# Patient Record
Sex: Female | Born: 1995 | Race: Black or African American | Hispanic: No | Marital: Single | Smoking: Never smoker
Health system: Western US, Academic
[De-identification: ages and names within clinical notes are randomized; demographics above are authoritative.]

## PROBLEM LIST (undated history)

## (undated) ENCOUNTER — Inpatient Hospital Stay (HOSPITAL_COMMUNITY): Admission: AD | Payer: Self-pay

## (undated) DIAGNOSIS — F938 Other childhood emotional disorders: Secondary | ICD-10-CM

## (undated) DIAGNOSIS — F32A Depression, unspecified: Secondary | ICD-10-CM

## (undated) DIAGNOSIS — R519 Headache, unspecified: Secondary | ICD-10-CM

## (undated) DIAGNOSIS — N390 Urinary tract infection, site not specified: Secondary | ICD-10-CM

## (undated) DIAGNOSIS — G4700 Insomnia, unspecified: Secondary | ICD-10-CM

## (undated) DIAGNOSIS — F3481 Disruptive mood dysregulation disorder: Secondary | ICD-10-CM

## (undated) DIAGNOSIS — F419 Anxiety disorder, unspecified: Secondary | ICD-10-CM

## (undated) DIAGNOSIS — R51 Headache: Secondary | ICD-10-CM

## (undated) HISTORY — PX: WISDOM TOOTH EXTRACTION: SHX21

---
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
## 2017-01-11 ENCOUNTER — Ambulatory Visit (INDEPENDENT_AMBULATORY_CARE_PROVIDER_SITE_OTHER): Payer: BLUE CROSS/BLUE SHIELD | Admitting: Adult Health

## 2017-01-11 ENCOUNTER — Encounter: Payer: Self-pay | Admitting: Adult Health

## 2017-01-11 VITALS — BP 102/60 | HR 58 | Resp 18 | Ht 62.0 in | Wt 111.0 lb

## 2017-01-11 DIAGNOSIS — R634 Abnormal weight loss: Secondary | ICD-10-CM | POA: Insufficient documentation

## 2017-01-11 DIAGNOSIS — F329 Major depressive disorder, single episode, unspecified: Secondary | ICD-10-CM | POA: Diagnosis not present

## 2017-01-11 DIAGNOSIS — Z113 Encounter for screening for infections with a predominantly sexual mode of transmission: Secondary | ICD-10-CM

## 2017-01-11 DIAGNOSIS — N939 Abnormal uterine and vaginal bleeding, unspecified: Secondary | ICD-10-CM

## 2017-01-11 DIAGNOSIS — F32A Depression, unspecified: Secondary | ICD-10-CM

## 2017-01-11 DIAGNOSIS — Z3202 Encounter for pregnancy test, result negative: Secondary | ICD-10-CM

## 2017-01-11 DIAGNOSIS — N644 Mastodynia: Secondary | ICD-10-CM | POA: Diagnosis not present

## 2017-01-11 LAB — POCT URINE PREGNANCY: PREG TEST UR: NEGATIVE

## 2017-01-11 NOTE — Progress Notes (Signed)
Subjective:     Patient ID: Sydney Higgins, female   DOB: Oct 14, 1995, 21 y.o.   MRN: [id number]  HPI Sydney Higgins is a 21 year old black female in complaining of heavy prolonged bleeding and weight loss of about 20 lbs in last 2 months and breast pain that is sharp at times, near the areola.  PCP is Dr Cindie Laroche.   Review of Systems +heavy prolonged bleeding +weight loss +breast pain +sexaully active without birth control  Reviewed past medical,surgical, social and family history. Reviewed medications and allergies.     Objective:   Physical Exam BP 102/60 (BP Location: Left Arm, Patient Position: Sitting, Cuff Size: Normal)   Pulse (!) 58   Resp 18   Ht 5\' 2"  (1.575 m)   Wt 111 lb (50.3 kg)   LMP 01/08/2017 (Exact Date)   BMI 20.30 kg/m UPT negative.  Skin warm and dry,  Breasts:no dominate palpable mass, retraction or nipple discharge Pelvic: external genitalia is normal in appearance no lesions, vagina: period like blood,urethra has no lesions or masses noted, cervix:smooth, no CMT, uterus: normal size, shape and contour, non tender, no masses felt, adnexa: no masses or tenderness noted. Bladder is non tender and no masses felt.  GC/CHL obtained.    PHQ 9 score 20, denies being suicidal or homicidal, was on Latuda and stopped about 4 months ago and had had counseling and was seen at [facility]. Declines any meds.She is not really interested in OCs, at this time.   Assessment:     1. Abnormal uterine bleeding (AUB)   2. Weight loss   3. Breast pain   4. Depression, unspecified depression type   5. Screening examination for STD (sexually transmitted disease)       Plan:     Check CBC,CMP,TSH and free T4, HIV and RPR GC/CHL sent F/U in 1 week to discuss labs and options

## 2017-01-12 LAB — CBC
HEMATOCRIT: 40.3 % (ref 34.0–46.6)
HEMOGLOBIN: 13.2 g/dL (ref 11.1–15.9)
MCH: 28 pg (ref 26.6–33.0)
MCHC: 32.8 g/dL (ref 31.5–35.7)
MCV: 85 fL (ref 79–97)
Platelets: 226 10*3/uL (ref 150–379)
RBC: 4.72 x10E6/uL (ref 3.77–5.28)
RDW: 13.4 % (ref 12.3–15.4)
WBC: 5.3 10*3/uL (ref 3.4–10.8)

## 2017-01-12 LAB — COMPREHENSIVE METABOLIC PANEL
ALBUMIN: 4.7 g/dL (ref 3.5–5.5)
ALK PHOS: 64 IU/L (ref 39–117)
ALT: 23 IU/L (ref 0–32)
AST: 19 IU/L (ref 0–40)
Albumin/Globulin Ratio: 1.5 (ref 1.2–2.2)
BILIRUBIN TOTAL: 0.3 mg/dL (ref 0.0–1.2)
BUN / CREAT RATIO: 10 (ref 9–23)
BUN: 8 mg/dL (ref 6–20)
CHLORIDE: 101 mmol/L (ref 96–106)
CO2: 25 mmol/L (ref 20–29)
CREATININE: 0.84 mg/dL (ref 0.57–1.00)
Calcium: 9.7 mg/dL (ref 8.7–10.2)
GFR calc non Af Amer: 101 mL/min/{1.73_m2} (ref >59)
GFR, EST AFRICAN AMERICAN: 117 mL/min/{1.73_m2} (ref >59)
GLOBULIN, TOTAL: 3.2 g/dL (ref 1.5–4.5)
Glucose: 92 mg/dL (ref 65–99)
Potassium: 4.6 mmol/L (ref 3.5–5.2)
SODIUM: 140 mmol/L (ref 134–144)
TOTAL PROTEIN: 7.9 g/dL (ref 6.0–8.5)

## 2017-01-12 LAB — T4, FREE: Free T4: 1.09 ng/dL (ref 0.93–1.60)

## 2017-01-12 LAB — HIV ANTIBODY (ROUTINE TESTING W REFLEX): HIV Screen 4th Generation wRfx: NONREACTIVE

## 2017-01-12 LAB — RPR: RPR: NONREACTIVE

## 2017-01-12 LAB — TSH: TSH: 2.28 u[IU]/mL (ref 0.450–4.500)

## 2017-01-13 ENCOUNTER — Ambulatory Visit: Payer: BLUE CROSS/BLUE SHIELD | Admitting: General Surgery

## 2017-01-13 LAB — GC/CHLAMYDIA PROBE AMP
CHLAMYDIA, DNA PROBE: NEGATIVE
Neisseria gonorrhoeae by PCR: NEGATIVE

## 2017-01-16 ENCOUNTER — Emergency Department (HOSPITAL_COMMUNITY): Payer: BLUE CROSS/BLUE SHIELD

## 2017-01-16 ENCOUNTER — Emergency Department (HOSPITAL_COMMUNITY)
Admission: EM | Admit: 2017-01-16 | Discharge: 2017-01-17 | Disposition: A | Discharge status: Home / Self Care | Payer: BLUE CROSS/BLUE SHIELD | Attending: Emergency Medicine | Admitting: Emergency Medicine

## 2017-01-16 ENCOUNTER — Encounter (HOSPITAL_COMMUNITY): Payer: Self-pay | Admitting: *Deleted

## 2017-01-16 DIAGNOSIS — S0181XA Laceration without foreign body of other part of head, initial encounter: Secondary | ICD-10-CM | POA: Diagnosis not present

## 2017-01-16 DIAGNOSIS — Y999 Unspecified external cause status: Secondary | ICD-10-CM | POA: Insufficient documentation

## 2017-01-16 DIAGNOSIS — S01112A Laceration without foreign body of left eyelid and periocular area, initial encounter: Secondary | ICD-10-CM | POA: Insufficient documentation

## 2017-01-16 DIAGNOSIS — Y939 Activity, unspecified: Secondary | ICD-10-CM | POA: Insufficient documentation

## 2017-01-16 DIAGNOSIS — Y92410 Unspecified street and highway as the place of occurrence of the external cause: Secondary | ICD-10-CM | POA: Insufficient documentation

## 2017-01-16 DIAGNOSIS — M549 Dorsalgia, unspecified: Secondary | ICD-10-CM | POA: Diagnosis not present
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
## 2017-01-16 NOTE — ED Notes (Signed)
Patient stated she was having chest tightness, EKG performed and given to EDP [facility].
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
## 2017-01-17 MED ORDER — CYCLOBENZAPRINE HCL 5 MG PO TABS: 10.0000 mg | ORAL_TABLET | Freq: Two times a day (BID) | ORAL | 0 refills | Status: DC | PRN

## 2017-01-17 MED ORDER — IBUPROFEN 600 MG PO TABS: 600.0000 mg | ORAL_TABLET | Freq: Four times a day (QID) | ORAL | 0 refills | Status: DC | PRN

## 2017-01-17 NOTE — Discharge Instructions (Signed)
Keep the lacerations clean and dry.  The steri-strips should begin to peel off in a week or so.  Apply ice packs on/off to your neck.  Follow-up with your doctor for recheck or return here if needed.

## 2017-01-18 ENCOUNTER — Ambulatory Visit: Payer: BLUE CROSS/BLUE SHIELD | Admitting: Adult Health

## 2017-02-14 ENCOUNTER — Emergency Department (HOSPITAL_COMMUNITY)
Admission: EM | Admit: 2017-02-14 | Discharge: 2017-02-14 | Disposition: A | Discharge status: Home / Self Care | Payer: BLUE CROSS/BLUE SHIELD | Attending: Emergency Medicine | Admitting: Emergency Medicine

## 2017-02-14 ENCOUNTER — Emergency Department (HOSPITAL_COMMUNITY): Payer: BLUE CROSS/BLUE SHIELD

## 2017-02-14 ENCOUNTER — Encounter (HOSPITAL_COMMUNITY): Payer: Self-pay

## 2017-02-14 DIAGNOSIS — J029 Acute pharyngitis, unspecified: Secondary | ICD-10-CM | POA: Diagnosis present

## 2017-02-14 DIAGNOSIS — R6889 Other general symptoms and signs: Secondary | ICD-10-CM | POA: Diagnosis not present

## 2017-02-14 LAB — CBC WITH DIFFERENTIAL/PLATELET
BASOS ABS: 0 10*3/uL (ref 0.0–0.1)
Basophils Relative: 0 %
Eosinophils Absolute: 0.2 10*3/uL (ref 0.0–0.7)
Eosinophils Relative: 2 %
HEMATOCRIT: 37.4 % (ref 36.0–46.0)
HEMOGLOBIN: 12.1 g/dL (ref 12.0–15.0)
LYMPHS PCT: 25 %
Lymphs Abs: 2 10*3/uL (ref 0.7–4.0)
MCH: 26.9 pg (ref 26.0–34.0)
MCHC: 32.4 g/dL (ref 30.0–36.0)
MCV: 83.1 fL (ref 78.0–100.0)
MONOS PCT: 9 %
Monocytes Absolute: 0.7 10*3/uL (ref 0.1–1.0)
NEUTROS ABS: 4.9 10*3/uL (ref 1.7–7.7)
NEUTROS PCT: 64 %
PLATELETS: 274 10*3/uL (ref 150–400)
RBC: 4.5 MIL/uL (ref 3.87–5.11)
RDW: 12.7 % (ref 11.5–15.5)
WBC: 7.8 10*3/uL (ref 4.0–10.5)

## 2017-02-14 LAB — BASIC METABOLIC PANEL
ANION GAP: 9 (ref 5–15)
BUN: 10 mg/dL (ref 6–20)
CALCIUM: 9.3 mg/dL (ref 8.9–10.3)
CO2: 25 mmol/L (ref 22–32)
Chloride: 104 mmol/L (ref 101–111)
Creatinine, Ser: 0.87 mg/dL (ref 0.44–1.00)
GFR calc Af Amer: >60 mL/min (ref >60)
GFR calc non Af Amer: >60 mL/min (ref >60)
GLUCOSE: 88 mg/dL (ref 65–99)
Potassium: 3.5 mmol/L (ref 3.5–5.1)
Sodium: 138 mmol/L (ref 135–145)

## 2017-02-14 LAB — MONONUCLEOSIS SCREEN: Mono Screen: NEGATIVE

## 2017-02-14 LAB — RAPID STREP SCREEN (MED CTR MEBANE ONLY): Streptococcus, Group A Screen (Direct): NEGATIVE

## 2017-02-14 MED ORDER — NAPROXEN 500 MG PO TABS: 500.0000 mg | ORAL_TABLET | Freq: Two times a day (BID) | ORAL | 0 refills | Status: DC

## 2017-02-14 NOTE — ED Triage Notes (Signed)
Pt reports sore throat and earache x 1 1/2 weeks.

## 2017-02-14 NOTE — Discharge Instructions (Signed)
Workup here today without any specific findings.  Chest x-ray negative for pneumonia.  Rapid strep test negative.  Mononucleosis test negative as well.  Suspect this is a viral upper respiratory infection with pharyngitis.  Take the Naprosyn for the throat pain.  Follow-up with your doctor.  Return for any new or worse symptoms.

## 2017-02-14 NOTE — ED Provider Notes (Signed)
[facility] EMERGENCY DEPARTMENT Provider Note   CSN: [id number] Arrival date & time: 02/14/17  3244     History   Chief Complaint Chief Complaint  Patient presents with  . Sore Throat    HPI Sydney Higgins is a 21 y.o. female.  Patient with a complaint of sore throat or ear pain cough congestion and some intermittent fevers for a week and a half.  No fevers recently.  Patient was evaluated at [facility] said she had a rapid strep test that was negative but they put her on antibiotic made no difference.  Patient states that she had mononucleosis at age 66.  Patient states she just does not feel well.      Past Medical History:  Diagnosis Date  . Anxiety   . 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 DMDD (disruptive mood dysregulation disorder) ([place]) 12/11/2015  . Headache   . Insomnia 12/11/2015    Patient Active Problem List   Diagnosis Date Noted  . Depression 01/11/2017  . Breast pain 01/11/2017  . Weight loss 01/11/2017  . Abnormal uterine bleeding (AUB) 01/11/2017  . Screening examination for STD (sexually transmitted disease) 01/11/2017  . Constipation 12/17/2015  . MDD (major depressive disorder), recurrent episode, severe ([place]) 12/11/2015  . 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 DMDD (disruptive mood dysregulation disorder) ([place]) 12/11/2015  . Insomnia 12/11/2015    History reviewed. No pertinent surgical history.  OB History    Gravida Para Term Preterm AB Living   2       2 0   SAB TAB Ectopic Multiple Live Births   2               Home Medications    Prior to Admission medications   Medication Sig Start Date End Date Taking? Authorizing Provider  cyclobenzaprine (FLEXERIL) 5 MG tablet Take 2 tablets (10 mg total) by mouth 2 (two) times daily as needed for muscle spasms. Patient not taking: Reported on 02/14/2017 01/17/17   Triplett, Lynelle Smoke, PA-C  ibuprofen (ADVIL,MOTRIN) 600 MG tablet Take 1 tablet (600 mg total) by mouth every 6 (six)  hours as needed. Patient not taking: Reported on 02/14/2017 01/17/17   Kem Parkinson, PA-C  naproxen (NAPROSYN) 500 MG tablet Take 1 tablet (500 mg total) 2 (two) times daily by mouth. 02/14/17   Fredia Sorrow, MD    Family History Family History  Problem Relation Age of Onset  . Cancer Paternal Grandfather   . Cancer Paternal Grandmother   . Diabetes Maternal Grandmother   . Diabetes Maternal Grandfather   . Hypertension Father   . Bipolar disorder Brother   . Deafness Sister     Social History Social History   Tobacco Use  . Smoking status: Never Smoker  . Smokeless tobacco: Never Used  Substance Use Topics  . Alcohol use: No  . Drug use: No     Allergies   Apple; Banana; Bee venom; and Pear   Review of Systems Review of Systems  Constitutional: Positive for fever.  HENT: Positive for congestion, ear pain and sore throat.   Eyes: Negative for redness.  Respiratory: Negative for shortness of breath.   Cardiovascular: Negative for chest pain.  Gastrointestinal: Negative for abdominal pain, diarrhea, nausea and vomiting.  Genitourinary: Negative for dysuria.  Musculoskeletal: Positive for myalgias.  Neurological: Negative for headaches.  Hematological: Does not bruise/bleed easily.  Psychiatric/Behavioral: Negative for confusion.     Physical Exam Updated Vital Signs BP 117/86   Pulse  93   Temp 98.3 F (36.8 C) (Oral)   Resp 18   Ht 1.575 m (5\' 2" )   Wt 53.5 kg (118 lb)   LMP 02/03/2017   SpO2 98%   BMI 21.58 kg/m   Physical Exam  Constitutional: She is oriented to person, place, and time. She appears well-developed and well-nourished. No distress.  HENT:  Head: Normocephalic and atraumatic.  Mouth/Throat: No oropharyngeal exudate.  Posterior pharynx without erythema.  Uvula midline.  No tonsillar enlargement.  No exudate.  Mucous membranes slightly dry.  Eyes: Conjunctivae and EOM are normal. Pupils are equal, round, and reactive to light.    Neck: Normal range of motion. Neck supple.  Cardiovascular: Normal rate, regular rhythm and normal heart sounds.  Pulmonary/Chest: Effort normal and breath sounds normal. No respiratory distress. She has no wheezes.  Abdominal: Soft. Bowel sounds are normal.  Musculoskeletal: Normal range of motion. She exhibits no edema.  Neurological: She is alert and oriented to person, place, and time. No cranial nerve deficit or sensory deficit. She exhibits normal muscle tone. Coordination normal.  Skin: Skin is warm. No rash noted.  Nursing note and vitals reviewed.    ED Treatments / Results  Labs (all labs ordered are listed, but only abnormal results are displayed) Labs Reviewed  RAPID STREP SCREEN (NOT AT [facility])  CULTURE, GROUP A STREP ([place])  CBC WITH DIFFERENTIAL/PLATELET  BASIC METABOLIC PANEL  MONONUCLEOSIS SCREEN    EKG  EKG Interpretation None       Radiology Dg Chest 2 View  Result Date: 02/14/2017 CLINICAL DATA:  Cough, fever, sore throat and ear pain for 1.5 weeks. EXAM: CHEST  2 VIEW COMPARISON:  Radiographs 02/06/2017 and 07/02/2016. FINDINGS: The heart size and mediastinal contours are normal. The lungs are clear. There is no pleural effusion or pneumothorax. No acute osseous findings are identified. Minimal thoracic scoliosis noted. IMPRESSION: No active cardiopulmonary process. Electronically Signed   By: Richardean Sale M.D.   On: 02/14/2017 09:10    Procedures Procedures (including critical care time)  Medications Ordered in ED Medications - No data to display   Initial Impression / Assessment and Plan / ED Course  I have reviewed the triage vital signs and the nursing notes.  Pertinent labs & imaging results that were available during my care of the patient were reviewed by me and considered in my medical decision making (see chart for details).    Workup rapid strep negative again.  Chest x-ray negative for pneumonia.  Monospot negative.  Labs without  significant abnormalities.  Patient symptoms probably consistent with a viral pharyngitis and may be flulike illness.  Based on the duration of the symptoms not a candidate for Tamiflu.  Patient will be treated symptomatically with Naprosyn for the sore throat which is her main complaint now.  No further fevers cough and congestion has improved.  Feel that patient may be starting to improve overall.     Final Clinical Impressions(s) / ED Diagnoses   Final diagnoses:  Acute pharyngitis, unspecified etiology  Flu-like symptoms    ED Discharge Orders        Ordered    naproxen (NAPROSYN) 500 MG tablet  2 times daily     02/14/17 1106       Fredia Sorrow, MD 02/14/17 1712

## 2017-02-16 LAB — CULTURE, GROUP A STREP (THRC)

## 2017-04-14 ENCOUNTER — Ambulatory Visit: Payer: BLUE CROSS/BLUE SHIELD | Admitting: Adult Health

## 2017-04-14 ENCOUNTER — Encounter: Payer: Self-pay | Admitting: Adult Health

## 2017-08-04 ENCOUNTER — Other Ambulatory Visit: Payer: Self-pay

## 2017-08-04 ENCOUNTER — Encounter (INDEPENDENT_AMBULATORY_CARE_PROVIDER_SITE_OTHER): Payer: Self-pay

## 2017-08-04 ENCOUNTER — Ambulatory Visit: Payer: BLUE CROSS/BLUE SHIELD | Admitting: Adult Health

## 2017-08-04 ENCOUNTER — Encounter: Payer: Self-pay | Admitting: Adult Health

## 2017-08-04 VITALS — BP 124/80 | HR 72 | Ht 61.0 in | Wt 99.0 lb

## 2017-08-04 DIAGNOSIS — R634 Abnormal weight loss: Secondary | ICD-10-CM | POA: Diagnosis not present

## 2017-08-04 DIAGNOSIS — R1030 Lower abdominal pain, unspecified: Secondary | ICD-10-CM | POA: Diagnosis not present

## 2017-08-04 DIAGNOSIS — R112 Nausea with vomiting, unspecified: Secondary | ICD-10-CM

## 2017-08-04 DIAGNOSIS — L720 Epidermal cyst: Secondary | ICD-10-CM | POA: Diagnosis not present

## 2017-08-04 NOTE — Progress Notes (Signed)
  Subjective:     Patient ID: Sydney Higgins, female   DOB: 09-20-1995, 22 y.o.   MRN: [id number]  HPI Naomy is a 22 year old black female in complaining of pain in abdomen for about a month, with nausea and vomiting at times and weight loss. She had labs with Dr Cindie Laroche 5/3 and all was normal except vitamin D low at 15.She gets depo at [facility], and was told she had ovarian cyst on past.  PCP is Dr Cindie Laroche.  Review of Systems +abdominal pain for about a month +nausea and vomiting at times +weight loss  Reviewed past medical,surgical, social and family history. Reviewed medications and allergies.     Objective:   Physical Exam BP 124/80 (BP Location: Right Arm, Patient Position: Sitting, Cuff Size: Normal)   Pulse 72   Ht 5\' 1"  (1.549 m)   Wt 99 lb (44.9 kg)   BMI 18.71 kg/m    Skin warm and dry. Neck: mid line trachea, normal thyroid, good ROM, no lymphadenopathy noted. Lungs: clear to ausculation bilaterally. Cardiovascular: regular rate and rhythm, has 2 cm, slightly tender,non mobile nodule left side of neck.Abdomen is soft, no HSM noted, +tenderness across lower pelvis. Has some point tenderness in low back.  Discussed if all is normal on [place] stopping depo and trying an OC.  Assessment:     1. Lower abdominal pain   2. Weight loss   3. Epidermal cyst of neck       Plan:     Get abdominal and pelvic US at [facility] 5/20 at 9:30 am Referred to Dr Constance Haw for cyst excision on neck F/U in 2 weeks

## 2017-08-11 ENCOUNTER — Encounter: Payer: Self-pay | Admitting: General Surgery

## 2017-08-11 ENCOUNTER — Ambulatory Visit: Payer: BLUE CROSS/BLUE SHIELD | Admitting: General Surgery

## 2017-08-11 VITALS — BP 136/92 | HR 84 | Temp 99.1°F | Resp 16 | Wt 102.0 lb

## 2017-08-11 DIAGNOSIS — L723 Sebaceous cyst: Secondary | ICD-10-CM

## 2017-08-11 NOTE — Patient Instructions (Signed)
Epidermal Cyst An epidermal cyst is a small, painless lump under your skin. It may be called an epidermal inclusion cyst or an infundibular cyst. The cyst contains a grayish-white, bad-smelling substance (keratin). It is important not to pop epidermal cysts yourself. These cysts are usually harmless (benign), but they can get infected. Symptoms of infection may include:  Redness.  Inflammation.  Tenderness.  Warmth.  Fever.  A grayish-white, bad-smelling substance draining from the cyst.  Pus draining from the cyst.  Follow these instructions at home:  Take over-the-counter and prescription medicines only as told by your doctor.  If you were prescribed an antibiotic, use it as told by your doctor. Do not stop using the antibiotic even if you start to feel better.  Keep the area around your cyst clean and dry.  Wear loose, dry clothing.  Do not try to pop your cyst.  Avoid touching your cyst.  Check your cyst every day for signs of infection.  Keep all follow-up visits as told by your doctor. This is important. How is this prevented?  Wear clean, dry, clothing.  Avoid wearing tight clothing.  Keep your skin clean and dry. Shower or take baths every day.  Wash your body with a benzoyl peroxide wash when you shower or bathe. Contact a health care provider if:  Your cyst has symptoms of infection.  Your condition is not improving or is getting worse.  You have a cyst that looks different from other cysts you have had.  You have a fever. Get help right away if:  Redness spreads from the cyst into the surrounding area. This information is not intended to replace advice given to you by your health care provider. Make sure you discuss any questions you have with your health care provider. Document Released: 04/22/2004 Document Revised: 11/12/2015 Document Reviewed: 01/15/2015 Elsevier Interactive Patient Education  2018 Elsevier Inc.  

## 2017-08-11 NOTE — Progress Notes (Signed)
[facility] History and Physical  Reason for Referral: Cyst on left neck Referring Physician:  Dr. Eda Paschal Sydney Higgins.  HPI: Sydney Higgins is a 22 yo otherwise healthy patient who presents with an enlarging cyst on the left lateral neck. She says it has been there for 4 years at least. It has slowly been growing and causes her to have some social anxiety related to people asking her about the lesion.  She denies any erythema, tenderness, or drainage.   She otherwise is healthy. And does not take medicine other than birth control. She has been having some fever and chills on and off.  Her father had a cyst that I removed from his arm several months ago.   Past Medical History:  Diagnosis Date  . Anxiety   . 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 DMDD (disruptive mood dysregulation disorder) ([place]) 12/11/2015  . Headache   . Insomnia 12/11/2015    History reviewed. No pertinent surgical history.  Family History  Problem Relation Age of Onset  . Cancer Paternal Grandfather   . Cancer Paternal Grandmother   . Diabetes Maternal Grandmother   . Diabetes Maternal Grandfather   . Hypertension Father   . Bipolar disorder Brother   . Deafness Sister     Social History   Tobacco Use  . Smoking status: Never Smoker  . Smokeless tobacco: Never Used  Substance Use Topics  . Alcohol use: No  . Drug use: No    Medications: I have reviewed the patient's current medications. Allergies as of 08/11/2017      Reactions   Apple    Can eat the fruit, but not the peel   Banana    Bee Venom    Pear       Medication List        Accurate as of 08/11/17  4:21 PM. Always use your most recent med list.          medroxyPROGESTERone Acetate 150 MG/ML Susy        ROS:  A comprehensive review of systems was negative except for: Constitutional: positive for chills, fevers and headache Gastrointestinal: positive for abdominal pain, nausea  and reflux symptoms Musculoskeletal: positive for back pain and neck pain  Blood pressure (!) 136/92, pulse 84, temperature 99.1 F (37.3 C), temperature source Temporal, resp. rate 16, weight 102 lb (46.3 kg). Physical Exam  Constitutional: She is oriented to person, place, and time. She appears well-developed and well-nourished.  HENT:  Head: Normocephalic and atraumatic.  Eyes: Pupils are equal, round, and reactive to light. EOM are normal.  Neck: Normal range of motion. Neck supple.  On posterior left neck 1cm hard area that is mobile, posterior to the SCM and in the mid neck   Cardiovascular: Normal rate and regular rhythm.  Pulmonary/Chest: Effort normal and breath sounds normal.  Abdominal: Soft. She exhibits no distension. There is no tenderness.  Musculoskeletal: Normal range of motion. She exhibits no edema.  Neurological: She is alert and oriented to person, place, and time.  Skin: Skin is dry.  Psychiatric: She has a normal mood and affect. Her behavior is normal. Judgment and thought content normal.  Vitals reviewed.   Results: CT max face from 12/2016- looked to see if I can see the cyst, on coronals see the start of it, which appears to be superficial to the muscle   Assessment & Plan:  Sydney Higgins is a  22 y.o. Higgins with a left neck sebaceous/ epidermoid cyst. She feels that it is bigger and is causing her some emotional distress/ anxiety due to people questioning her about it.  She is self conscious and wears certain clothes and jewelry.   -OR for excision of the cyst in the upcoming weeks    All questions were answered to the satisfaction of the patient and family.  The risk and benefits of excision of the cyst were discussed including but not limited to bleeding, infection, risk of recurrence, risk of nerve injury/ numbness, risk of being something other than a cyst like a lymph node.  After careful consideration, Sydney Higgins has decided to proceed.    Sydney Higgins 08/11/2017, 4:21 PM

## 2017-08-12 NOTE — H&P (Signed)
[facility] History and Physical  Reason for Referral: Cyst on left neck Referring Physician:  Dr. Eda Paschal Sydney Higgins is a 22 y.o. female.  HPI: Sydney Higgins is a 22 yo otherwise healthy patient who presents with an enlarging cyst on the left lateral neck. She says it has been there for 4 years at least. It has slowly been growing and causes her to have some social anxiety related to people asking her about the lesion.  She denies any erythema, tenderness, or drainage.   She otherwise is healthy. And does not take medicine other than birth control. She has been having some fever and chills on and off.  Her father had a cyst that I removed from his arm several months ago.       Past Medical History:  Diagnosis Date  . Anxiety   . 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 DMDD (disruptive mood dysregulation disorder) ([place]) 12/11/2015  . Headache   . Insomnia 12/11/2015    History reviewed. No pertinent surgical history.       Family History  Problem Relation Age of Onset  . Cancer Paternal Grandfather   . Cancer Paternal Grandmother   . Diabetes Maternal Grandmother   . Diabetes Maternal Grandfather   . Hypertension Father   . Bipolar disorder Brother   . Deafness Sister     Social History       Tobacco Use  . Smoking status: Never Smoker  . Smokeless tobacco: Never Used  Substance Use Topics  . Alcohol use: No  . Drug use: No    Medications: I have reviewed the patient's current medications.      Allergies as of 08/11/2017      Reactions   Apple    Can eat the fruit, but not the peel   Banana    Bee Venom    Pear                Medication List            Accurate as of 08/11/17  4:21 PM. Always use your most recent med list.           medroxyPROGESTERone Acetate 150 MG/ML Susy        ROS:  A comprehensive review of systems was negative except for: Constitutional: positive for  chills, fevers and headache Gastrointestinal: positive for abdominal pain, nausea and reflux symptoms Musculoskeletal: positive for back pain and neck pain  Blood pressure (!) 136/92, pulse 84, temperature 99.1 F (37.3 C), temperature source Temporal, resp. rate 16, weight 102 lb (46.3 kg). Physical Exam  Constitutional: She is oriented to person, place, and time. She appears well-developed and well-nourished.  HENT:  Head: Normocephalic and atraumatic.  Eyes: Pupils are equal, round, and reactive to light. EOM are normal.  Neck: Normal range of motion. Neck supple.  On posterior left neck 1cm hard area that is mobile, posterior to the SCM and in the mid neck   Cardiovascular: Normal rate and regular rhythm.  Pulmonary/Chest: Effort normal and breath sounds normal.  Abdominal: Soft. She exhibits no distension. There is no tenderness.  Musculoskeletal: Normal range of motion. She exhibits no edema.  Neurological: She is alert and oriented to person, place, and time.  Skin: Skin is dry.  Psychiatric: She has a normal mood and affect. Her behavior is normal. Judgment and thought content normal.  Vitals reviewed.   Results: CT max face from 12/2016- looked to see  if I can see the cyst, on coronals see the start of it, which appears to be superficial to the muscle   Assessment & Plan:  Sydney Higgins is a 22 y.o. female with a left neck sebaceous/ epidermoid cyst. She feels that it is bigger and is causing her some emotional distress/ anxiety due to people questioning her about it.  She is self conscious and wears certain clothes and jewelry.   -OR for excision of the cyst in the upcoming weeks    All questions were answered to the satisfaction of the patient and family.  The risk and benefits of excision of the cyst were discussed including but not limited to bleeding, infection, risk of recurrence, risk of nerve injury/ numbness, risk of being something other than a cyst like a  lymph node.  After careful consideration, Sydney Higgins has decided to proceed.    Virl Cagey 08/11/2017, 4:21 PM

## 2017-08-15 ENCOUNTER — Ambulatory Visit (HOSPITAL_COMMUNITY)
Admission: RE | Admit: 2017-08-15 | Discharge: 2017-08-15 | Disposition: A | Discharge status: Home / Self Care | Payer: BLUE CROSS/BLUE SHIELD | Source: Ambulatory Visit | Attending: Adult Health | Admitting: Adult Health

## 2017-08-15 DIAGNOSIS — R1030 Lower abdominal pain, unspecified: Secondary | ICD-10-CM

## 2017-08-15 DIAGNOSIS — R634 Abnormal weight loss: Secondary | ICD-10-CM

## 2017-08-16 ENCOUNTER — Telehealth: Payer: Self-pay | Admitting: *Deleted

## 2017-08-16 NOTE — Telephone Encounter (Signed)
Pt aware [place] results normal, has appt 5/23 will discuss birth control options then ,may stop depo and try something else

## 2017-08-18 ENCOUNTER — Ambulatory Visit: Payer: BLUE CROSS/BLUE SHIELD | Admitting: Adult Health

## 2017-08-18 ENCOUNTER — Telehealth: Payer: Self-pay | Admitting: Obstetrics & Gynecology

## 2017-08-18 NOTE — Telephone Encounter (Signed)
Spoke with pt's mom letting her know it was up to pt if she comes in or not. JAG was going to discuss stopping Depo and starting new birth control. Pt's mom stated they would come in today for appt. [place]

## 2017-08-18 NOTE — Telephone Encounter (Signed)
Pt's mom called back stating pt was going to reschedule. Will call back and schedule appt. [place]

## 2017-08-19 NOTE — Patient Instructions (Signed)
Sydney Higgins  08/19/2017     @PREFPERIOPPHARMACY @   Your procedure is scheduled on 08/26/2017.  Report to [facility] at 7:30 A.M.  Call this number if you have problems the morning of surgery:  [phone]   Remember:  No food or drink after midnight.     Take these medicines the morning of surgery with A SIP OF WATER NONE    Do not wear jewelry, make-up or nail polish.  Do not wear lotions, powders, or perfumes, or deodorant.  Do not shave 48 hours prior to surgery.  Men may shave face and neck.  Do not bring valuables to the hospital.  [facility] is not responsible for any belongings or valuables.  Contacts, dentures or bridgework may not be worn into surgery.  Leave your suitcase in the car.  After surgery it may be brought to your room.  For patients admitted to the hospital, discharge time will be determined by your treatment team.  Patients discharged the day of surgery will not be allowed to drive home.   Please read over the following fact sheets that you were given. Surgical Site Infection Prevention and Anesthesia Post-op Instructions     PATIENT INSTRUCTIONS POST-ANESTHESIA  IMMEDIATELY FOLLOWING SURGERY:  Do not drive or operate machinery for the first twenty four hours after surgery.  Do not make any important decisions for twenty four hours after surgery or while taking narcotic pain medications or sedatives.  If you develop intractable nausea and vomiting or a severe headache please notify your doctor immediately.  FOLLOW-UP:  Please make an appointment with your surgeon as instructed. You do not need to follow up with anesthesia unless specifically instructed to do so.  WOUND CARE INSTRUCTIONS (if applicable):  Keep a dry clean dressing on the anesthesia/puncture wound site if there is drainage.  Once the wound has quit draining you may leave it open to air.  Generally you should leave the bandage intact for twenty four hours unless there is drainage.  If  the epidural site drains for more than 36-48 hours please call the anesthesia department.  QUESTIONS?:  Please feel free to call your physician or the hospital operator if you have any questions, and they will be happy to assist you.      Excision of Skin Lesions Excision of a skin lesion refers to the removal of a section of skin by making small cuts (incisions) in the skin. This procedure may be done to remove a cancerous (malignant) or noncancerous (benign) growth on the skin. It is typically done to treat or prevent cancer or infection. It may also be done to improve cosmetic appearance. The procedure may be done to remove:  Cancerous growths, such as basal cell carcinoma, squamous cell carcinoma, or melanoma.  Noncancerous growths, such as a cyst or lipoma.  Growths, such as moles or skin tags, which may be removed for cosmetic reasons.  Various excision or surgical techniques may be used depending on your condition, the location of the lesion, and your overall health. Tell a health care provider about:  Any allergies you have.  All medicines you are taking, including vitamins, herbs, eye drops, creams, and over-the-counter medicines.  Any problems you or family members have had with anesthetic medicines.  Any blood disorders you have.  Any surgeries you have had.  Any medical conditions you have.  Whether you are pregnant or may be pregnant. What are the risks? Generally, this is a safe procedure. However, problems  may occur, including:  Bleeding.  Infection.  Scarring.  Recurrence of the cyst, lipoma, or cancer.  Changes in skin sensation or appearance, such as discoloration or swelling.  Reaction to the anesthetics.  Allergic reaction to surgical materials or ointments.  Damage to nerves, blood vessels, muscles, or other structures.  Continued pain.  What happens before the procedure?  Ask your health care provider about: ? Changing or stopping your  regular medicines. This is especially important if you are taking diabetes medicines or blood thinners. ? Taking medicines such as aspirin and ibuprofen. These medicines can thin your blood. Do not take these medicines before your procedure if your health care provider instructs you not to.  You may be asked to take certain medicines.  You may be asked to stop smoking.  You may have an exam or testing.  Plan to have someone take you home after the procedure.  Plan to have someone help you with activities during recovery. What happens during the procedure?  To reduce your risk of infection: ? Your health care team will wash or sanitize their hands. ? Your skin will be washed with soap.  You will be given a medicine to numb the area (local anesthetic).  One of the following excision techniques will be performed.  At the end of any of these procedures, antibiotic ointment will be applied as needed. Each of the following techniques may vary among health care providers and hospitals. Complete Surgical Excision The area of skin that needs to be removed will be marked with a pen. Using a small scalpel or scissors, the surgeon will gently cut around and under the lesion until it is completely removed. The lesion will be placed in a fluid and sent to the lab for examination. If necessary, bleeding will be controlled with a device that delivers heat (electrocautery). The edges of the wound may be stitched (sutured) together, and a bandage (dressing) will be applied. This procedure may be performed to treat a cancerous growth or a noncancerous cyst or lesion. Excision of a Cyst The surgeon will make an incision on the cyst. The entire cyst will be removed through the incision. The incision may be closed with sutures. Shave Excision During shave excision, the surgeon will use a small blade or an electrically heated loop instrument to shave off the lesion. This may be done to remove a mole or a skin  tag. The wound will usually be left to heal on its own without sutures. Punch Excision During punch excision, the surgeon will use a small tool that is like a cookie cutter or a hole punch to cut a circle shape out of the skin. The outer edges of the skin will be sutured together. This may be done to remove a mole or a scar or to perform a biopsy of the lesion. Mohs Micrographic Surgery During Mohs micrographic surgery, layers of the lesion will be removed with a scalpel or a loop instrument and will be examined right away under a microscope. Layers will be removed until all of the abnormal or cancerous tissue has been removed. This procedure is minimally invasive, and it ensures the best cosmetic outcome. It involves the removal of as little normal tissue as possible. Mohs is usually done to treat skin cancer, such as basal cell carcinoma or squamous cell carcinoma, particularly on the face and ears. Depending on the size of the surgical wound, it may be sutured closed. What happens after the procedure?  Return to  your normal activities as told by your health care provider.  Talk with your health care provider to discuss any test results, treatment options, and if necessary, the need for more tests. This information is not intended to replace advice given to you by your health care provider. Make sure you discuss any questions you have with your health care provider. Document Released: 06/09/2009 Document Revised: 08/21/2015 Document Reviewed: 05/01/2014 Elsevier Interactive Patient Education  [employer].

## 2017-08-23 ENCOUNTER — Encounter (HOSPITAL_COMMUNITY): Payer: Self-pay

## 2017-08-23 ENCOUNTER — Encounter (HOSPITAL_COMMUNITY)
Admission: RE | Admit: 2017-08-23 | Discharge: 2017-08-23 | Disposition: A | Discharge status: Home / Self Care | Payer: BLUE CROSS/BLUE SHIELD | Source: Ambulatory Visit | Attending: General Surgery | Admitting: General Surgery

## 2017-08-23 ENCOUNTER — Other Ambulatory Visit: Payer: Self-pay

## 2017-08-23 DIAGNOSIS — Z01812 Encounter for preprocedural laboratory examination: Secondary | ICD-10-CM | POA: Diagnosis present

## 2017-08-23 LAB — CBC
HEMATOCRIT: 37 % (ref 36.0–46.0)
HEMOGLOBIN: 12 g/dL (ref 12.0–15.0)
MCH: 27.3 pg (ref 26.0–34.0)
MCHC: 32.4 g/dL (ref 30.0–36.0)
MCV: 84.3 fL (ref 78.0–100.0)
Platelets: 205 10*3/uL (ref 150–400)
RBC: 4.39 MIL/uL (ref 3.87–5.11)
RDW: 13.7 % (ref 11.5–15.5)
WBC: 5.8 10*3/uL (ref 4.0–10.5)

## 2017-08-23 LAB — HCG, SERUM, QUALITATIVE: Preg, Serum: NEGATIVE

## 2017-08-26 ENCOUNTER — Ambulatory Visit (HOSPITAL_COMMUNITY): Payer: BLUE CROSS/BLUE SHIELD | Admitting: Anesthesiology

## 2017-08-26 ENCOUNTER — Ambulatory Visit (HOSPITAL_COMMUNITY)
Admission: RE | Admit: 2017-08-26 | Discharge: 2017-08-26 | Disposition: A | Discharge status: Home / Self Care | Payer: BLUE CROSS/BLUE SHIELD | Source: Ambulatory Visit | Attending: General Surgery | Admitting: General Surgery

## 2017-08-26 ENCOUNTER — Encounter (HOSPITAL_COMMUNITY): Payer: Self-pay | Admitting: *Deleted

## 2017-08-26 ENCOUNTER — Encounter (HOSPITAL_COMMUNITY)
Admission: RE | Disposition: A | Discharge status: Home / Self Care | Payer: Self-pay | Source: Ambulatory Visit | Attending: General Surgery

## 2017-08-26 DIAGNOSIS — D234 Other benign neoplasm of skin of scalp and neck: Secondary | ICD-10-CM | POA: Insufficient documentation

## 2017-08-26 DIAGNOSIS — G4700 Insomnia, unspecified: Secondary | ICD-10-CM | POA: Insufficient documentation

## 2017-08-26 DIAGNOSIS — Z79899 Other long term (current) drug therapy: Secondary | ICD-10-CM | POA: Diagnosis not present

## 2017-08-26 DIAGNOSIS — F419 Anxiety disorder, unspecified: Secondary | ICD-10-CM | POA: Insufficient documentation

## 2017-08-26 DIAGNOSIS — R221 Localized swelling, mass and lump, neck: Secondary | ICD-10-CM

## 2017-08-26 DIAGNOSIS — L720 Epidermal cyst: Secondary | ICD-10-CM

## 2017-08-26 HISTORY — PX: MASS EXCISION: SHX2000

## 2017-08-26 SURGERY — EXCISION MASS
Anesthesia: General | Site: Neck | Laterality: Left

## 2017-08-26 MED ORDER — PROPOFOL 10 MG/ML IV BOLUS
INTRAVENOUS | Status: AC
  Filled 2017-08-26: qty 20

## 2017-08-26 MED ORDER — FENTANYL CITRATE (PF) 100 MCG/2ML IJ SOLN: 25.0000 ug | INTRAMUSCULAR | Status: DC | PRN

## 2017-08-26 MED ORDER — ONDANSETRON HCL 4 MG/2ML IJ SOLN
INTRAMUSCULAR | Status: AC
  Filled 2017-08-26: qty 2

## 2017-08-26 MED ORDER — PROPOFOL 10 MG/ML IV BOLUS
INTRAVENOUS | Status: DC | PRN
  Administered 2017-08-26: 150 mg via INTRAVENOUS

## 2017-08-26 MED ORDER — CEFAZOLIN SODIUM-DEXTROSE 2-4 GM/100ML-% IV SOLN
2.0000 g | INTRAVENOUS | Status: AC
  Administered 2017-08-26: 2 g via INTRAVENOUS

## 2017-08-26 MED ORDER — CHLORHEXIDINE GLUCONATE CLOTH 2 % EX PADS: 6.0000 | MEDICATED_PAD | Freq: Once | CUTANEOUS | Status: DC

## 2017-08-26 MED ORDER — FENTANYL CITRATE (PF) 100 MCG/2ML IJ SOLN
INTRAMUSCULAR | Status: AC
Start: 2017-08-26 — End: ?
  Filled 2017-08-26: qty 2

## 2017-08-26 MED ORDER — GLYCOPYRROLATE 0.2 MG/ML IJ SOLN
INTRAMUSCULAR | Status: AC
  Filled 2017-08-26: qty 1

## 2017-08-26 MED ORDER — MIDAZOLAM HCL 5 MG/5ML IJ SOLN
INTRAMUSCULAR | Status: DC | PRN
  Administered 2017-08-26 (×2): 1 mg via INTRAVENOUS

## 2017-08-26 MED ORDER — LIDOCAINE HCL (PF) 1 % IJ SOLN
INTRAMUSCULAR | Status: DC | PRN
  Administered 2017-08-26: .5 mL

## 2017-08-26 MED ORDER — CEFAZOLIN SODIUM-DEXTROSE 2-3 GM-%(50ML) IV SOLR
INTRAVENOUS | Status: DC | PRN
  Administered 2017-08-26: 2 g via INTRAVENOUS

## 2017-08-26 MED ORDER — HYDROCODONE-ACETAMINOPHEN 7.5-325 MG PO TABS
1.0000 | ORAL_TABLET | Freq: Once | ORAL | Status: AC | PRN
  Administered 2017-08-26: 1 via ORAL
  Filled 2017-08-26: qty 1

## 2017-08-26 MED ORDER — ONDANSETRON HCL 4 MG/2ML IJ SOLN
INTRAMUSCULAR | Status: DC | PRN
  Administered 2017-08-26: 4 mg via INTRAVENOUS

## 2017-08-26 MED ORDER — LIDOCAINE HCL (PF) 1 % IJ SOLN
INTRAMUSCULAR | Status: AC
  Filled 2017-08-26: qty 30

## 2017-08-26 MED ORDER — FENTANYL CITRATE (PF) 100 MCG/2ML IJ SOLN
INTRAMUSCULAR | Status: DC | PRN
  Administered 2017-08-26 (×2): 50 ug via INTRAVENOUS

## 2017-08-26 MED ORDER — CEFAZOLIN SODIUM-DEXTROSE 2-4 GM/100ML-% IV SOLN
INTRAVENOUS | Status: AC
  Filled 2017-08-26: qty 100

## 2017-08-26 MED ORDER — DOCUSATE SODIUM 100 MG PO CAPS: 100.0000 mg | ORAL_CAPSULE | Freq: Two times a day (BID) | ORAL | 2 refills | Status: DC

## 2017-08-26 MED ORDER — OXYCODONE HCL 5 MG PO TABS: 5.0000 mg | ORAL_TABLET | ORAL | 0 refills | Status: DC | PRN

## 2017-08-26 MED ORDER — LACTATED RINGERS IV SOLN
INTRAVENOUS | Status: DC
  Administered 2017-08-26: 1000 mL via INTRAVENOUS

## 2017-08-26 MED ORDER — 0.9 % SODIUM CHLORIDE (POUR BTL) OPTIME
TOPICAL | Status: DC | PRN
  Administered 2017-08-26: 1000 mL

## 2017-08-26 MED ORDER — MIDAZOLAM HCL 2 MG/2ML IJ SOLN
INTRAMUSCULAR | Status: AC
  Filled 2017-08-26: qty 2

## 2017-08-26 SURGICAL SUPPLY — 29 items
BENZOIN TINCTURE PRP APPL 2/3 (GAUZE/BANDAGES/DRESSINGS) ×2 IMPLANT
CHLORAPREP W/TINT 10.5 ML (MISCELLANEOUS) ×2 IMPLANT
CLOTH BEACON ORANGE TIMEOUT ST (SAFETY) ×2 IMPLANT
COVER LIGHT HANDLE STERIS (MISCELLANEOUS) ×4 IMPLANT
DECANTER SPIKE VIAL GLASS SM (MISCELLANEOUS) ×2 IMPLANT
DRSG TEGADERM 2-3/8X2-3/4 SM (GAUZE/BANDAGES/DRESSINGS) ×2 IMPLANT
ELECT NEEDLE TIP 2.8 STRL (NEEDLE) ×2 IMPLANT
ELECT REM PT RETURN 9FT ADLT (ELECTROSURGICAL) ×2
ELECTRODE REM PT RTRN 9FT ADLT (ELECTROSURGICAL) ×1 IMPLANT
GLOVE BIOGEL PI IND STRL 6.5 (GLOVE) ×2 IMPLANT
GLOVE BIOGEL PI IND STRL 7.0 (GLOVE) ×1 IMPLANT
GLOVE BIOGEL PI INDICATOR 6.5 (GLOVE) ×2
GLOVE BIOGEL PI INDICATOR 7.0 (GLOVE) ×1
GLOVE SURG SS PI 6.5 STRL IVOR (GLOVE) ×4 IMPLANT
GOWN STRL REUS W/ TWL XL LVL3 (GOWN DISPOSABLE) ×1 IMPLANT
GOWN STRL REUS W/TWL LRG LVL3 (GOWN DISPOSABLE) ×2 IMPLANT
GOWN STRL REUS W/TWL XL LVL3 (GOWN DISPOSABLE) ×1
KIT TURNOVER KIT A (KITS) ×2 IMPLANT
MANIFOLD NEPTUNE II (INSTRUMENTS) ×2 IMPLANT
NEEDLE HYPO 25X1 1.5 SAFETY (NEEDLE) ×2 IMPLANT
NS IRRIG 1000ML POUR BTL (IV SOLUTION) ×2 IMPLANT
PACK MINOR (CUSTOM PROCEDURE TRAY) ×2 IMPLANT
PAD ARMBOARD 7.5X6 YLW CONV (MISCELLANEOUS) ×2 IMPLANT
SET BASIN LINEN APH (SET/KITS/TRAYS/PACK) ×2 IMPLANT
SPONGE GAUZE 2X2 8PLY STRL LF (GAUZE/BANDAGES/DRESSINGS) ×2 IMPLANT
SUT PROLENE 3 0 PS 1 (SUTURE) ×2 IMPLANT
SUT VIC AB 3-0 SH 27 (SUTURE) ×1
SUT VIC AB 3-0 SH 27X BRD (SUTURE) ×1 IMPLANT
SYR CONTROL 10ML LL (SYRINGE) ×2 IMPLANT

## 2017-08-26 NOTE — Op Note (Signed)
[facility] Operative Note  08/26/17  Preoperative Diagnosis:  Sebaceous Cyst    Postoperative Diagnosis: ? Left neck mass possible lymph node    Procedure(s) Performed:  Excision of left neck mass, 1cm    Surgeon: Lanell Matar. Constance Haw, MD   Assistants: No qualified resident was available   Anesthesia: General endotracheal   Anesthesiologist: Lenice Llamas, MD    Specimens: Left neck mass, possible lymph node to pathology    Estimated Blood Loss: Minimal   Blood Replacement: None    Complications: None   Wound Class: Clean    Operative Indications: Ms. Fetting is a 22 yo otherwise healthy patient who has had a lump on her neck for several years that we assumed as a cyst like lesion.  She felt that it had been enlarging and was causing her some distress due to people asking her questions about the mass.  After a discussion of the risk and benefits of surgery, she opted to have it removed.   Findings: Hard, superficial mass very close to the epidermis    Procedure: The patient was taken to the operating room and placed supine. General endotracheal anesthesia was induced. Intravenous antibiotics were administered per protocol.   The left neck was prepared and draped in the usual sterile fashion.   A incision was made in a natural skin crease and taken down through the epidermis.  The mass was noted to be hard but superficial to the muscles and very adherent to the dermis/ epidermis.  With a combinations of electrocautery and sharp/ blunt dissection, it was encircled and excised.  The skin was very thinned in the area, and was likely going to necrosis. Due to this, a small elliptical excision of the thinned skin was made.  Final inspection revealed acceptable hemostasis. The skin was loose and easily closed without tension. A 3-0 Vicryl was use to close the dermis and a 3-0 Prolene was used to close the skin in an interrupted fashion. A sterile gauze and Tegaderm  were placed over the suture line.     All counts were correct at the end of the case. The patient was awakened from anesthesia and extubate without complication.  The patient went to the PACU in stable condition.   Curlene Labrum, MD [facility] [address] [place], [place] [address] [phone] (office)

## 2017-08-26 NOTE — Interval H&P Note (Signed)
History and Physical Interval Note:  08/26/2017 8:48 AM  Sydney Higgins  has presented today for surgery, with the diagnosis of cyst left side of neck  The various methods of treatment have been discussed with the patient and family. After consideration of risks, benefits and other options for treatment, the patient has consented to  Procedure(s): EXCISION CYST LEFT SIDE NECK (Left) as a surgical intervention .  The patient's history has been reviewed, patient examined, no change in status, stable for surgery.  I have reviewed the patient's chart and labs.  Questions were answered to the patient's satisfaction.     No additional questions. Marked.   Virl Cagey

## 2017-08-26 NOTE — Anesthesia Procedure Notes (Signed)
Procedure Name: LMA Insertion Date/Time: 08/26/2017 9:06 AM Performed by: Jonna Munro, CRNA Pre-anesthesia Checklist: Patient identified, Emergency Drugs available, Suction available, Patient being monitored and Timeout performed Patient Re-evaluated:Patient Re-evaluated prior to induction Oxygen Delivery Method: Circle system utilized Preoxygenation: Pre-oxygenation with 100% oxygen Induction Type: IV induction LMA: LMA inserted LMA Size: 3.0 Number of attempts: 1 Placement Confirmation: positive ETCO2 and breath sounds checked- equal and bilateral Tube secured with: Tape Dental Injury: Teeth and Oropharynx as per pre-operative assessment

## 2017-08-26 NOTE — Anesthesia Postprocedure Evaluation (Signed)
Anesthesia Post Note  Patient: Medical illustrator  Procedure(s) Performed: EXCISION SUPERFICIAL MASS FROM LEFT SIDE OF NECK (Left Neck)  Patient location during evaluation: PACU Anesthesia Type: General Level of consciousness: awake and alert and oriented Pain management: pain level controlled Vital Signs Assessment: post-procedure vital signs reviewed and stable Respiratory status: spontaneous breathing, nonlabored ventilation and respiratory function stable Cardiovascular status: blood pressure returned to baseline Postop Assessment: no apparent nausea or vomiting Anesthetic complications: no     Last Vitals:  Vitals:   08/26/17 1030 08/26/17 1045  BP: 126/81 (!) 142/84  Pulse: 78 60  Resp: 12 18  Temp:  36.7 C  SpO2: 100% 100%    Last Pain:  Vitals:   08/26/17 1045  TempSrc: Oral  PainSc: 3                  Devona Holmes

## 2017-08-26 NOTE — Transfer of Care (Signed)
Immediate Anesthesia Transfer of Care Note  Patient: Sydney Higgins  Procedure(s) Performed: EXCISION SUPERFICIAL MASS FROM LEFT SIDE OF NECK (Left Neck)  Patient Location: PACU  Anesthesia Type:General  Level of Consciousness: awake, alert  and oriented  Airway & Oxygen Therapy: Patient Spontanous Breathing and Patient connected to nasal cannula oxygen  Post-op Assessment: Report given to RN and Post -op Vital signs reviewed and stable  Post vital signs: Reviewed and stable  Last Vitals:  Vitals Value Taken Time  BP 109/79 08/26/2017 10:10 AM  Temp    Pulse 66 08/26/2017 10:13 AM  Resp 17 08/26/2017 10:13 AM  SpO2 87 % 08/26/2017 10:13 AM  Vitals shown include unvalidated device data.  Last Pain:  Vitals:   08/26/17 0801  TempSrc: Oral  PainSc: 0-No pain      Patients Stated Pain Goal: 8 (50/51/83 3582)  Complications: No apparent anesthesia complications

## 2017-08-26 NOTE — Discharge Instructions (Signed)
Discharge Instructions: Shower per your regular routine but limit the water you get on the left neck. Pat dry with a towel.  Take tylenol and ibuprofen as needed for pain control, alternating every 4-6 hours.  Take Roxicodone for breakthrough pain. Take colace for constipation related to narcotic pain medication. Do not remove the bandage from your left neck.  If the bandage starts to peel off or becomes soaked with blood, you can remove it, and place a new large Band-Aid over the area.  I will remove the bandage and stitches on Tuesday when you come to the office.    Excision of Skin Lesions, Care After Refer to this sheet in the next few weeks. These instructions provide you with information about caring for yourself after your procedure. Your health care provider may also give you more specific instructions. Your treatment has been planned according to current medical practices, but problems sometimes occur. Call your health care provider if you have any problems or questions after your procedure. What can I expect after the procedure? After your procedure, it is common to have pain or discomfort at the excision site. Follow these instructions at home:  Take over-the-counter and prescription medicines only as told by your health care provider.  Follow instructions from your health care provider about: ? How to take care of your excision site. You should keep the site clean, dry, and protected for at least 48 hours. ? When and how you should change your bandage (dressing). ? When you should remove your dressing. ? Removing whatever was used to close your excision site.  Check the excision area every day for signs of infection. Watch for: ? Redness, swelling, or pain. ? Fluid, blood, or pus.  For bleeding, apply gentle but firm pressure to the area using a folded towel for 20 minutes.  Avoid high-impact exercise and activities until the stitches (sutures) are removed or the area  heals.  Follow instructions from your health care provider about how to minimize scarring. Avoid sun exposure until the area has healed. Scarring should lessen over time.  Keep all follow-up visits as told by your health care provider. This is important. Contact a health care provider if:  You have a fever.  You have redness, swelling, or pain at the excision site.  You have fluid, blood, or pus coming from the excision site.  You have ongoing bleeding at the excision site.  You have pain that does not improve in 2-3 days after your procedure.  You notice skin irregularities or changes in sensation. This information is not intended to replace advice given to you by your health care provider. Make sure you discuss any questions you have with your health care provider. Document Released: 07/30/2014 Document Revised: 08/21/2015 Document Reviewed: 05/01/2014 Elsevier Interactive Patient Education  [employer].

## 2017-08-26 NOTE — Anesthesia Preprocedure Evaluation (Signed)
Anesthesia Evaluation  Patient identified by MRN, date of birth, ID band Patient awake    Reviewed: Allergy & Precautions, NPO status , Patient's Chart, lab work & pertinent test results  Airway Mallampati: I  TM Distance: >3 FB Neck ROM: Full    Dental no notable dental hx.    Pulmonary neg pulmonary ROS,    Pulmonary exam normal breath sounds clear to auscultation       Cardiovascular Exercise Tolerance: Good negative cardio ROS Normal cardiovascular examI Rhythm:Regular Rate:Normal     Neuro/Psych  Headaches, PSYCHIATRIC DISORDERS Anxiety Depression negative neurological ROS  negative psych ROS   GI/Hepatic negative GI ROS, Neg liver ROS,   Endo/Other  negative endocrine ROS  Renal/GU negative Renal ROS  negative genitourinary   Musculoskeletal negative musculoskeletal ROS (+)   Abdominal   Peds negative pediatric ROS (+)  Hematology negative hematology ROS (+)   Anesthesia Other Findings   Reproductive/Obstetrics negative OB ROS                             Anesthesia Physical Anesthesia Plan  ASA: II  Anesthesia Plan: General   Post-op Pain Management:    Induction: Intravenous  PONV Risk Score and Plan:   Airway Management Planned: LMA  Additional Equipment:   Intra-op Plan:   Post-operative Plan: Extubation in OR  Informed Consent: I have reviewed the patients History and Physical, chart, labs and discussed the procedure including the risks, benefits and alternatives for the proposed anesthesia with the patient or authorized representative who has indicated his/her understanding and acceptance.   Dental advisory given  Plan Discussed with: CRNA  Anesthesia Plan Comments:         Anesthesia Quick Evaluation

## 2017-08-26 NOTE — Progress Notes (Signed)
Patient's mother Iliany Losier notified via phone at [phone] that patient has left her piercing jewelry at hospital. Mother to check at [employer] desk to pick up today.  Beaded stud with red, black, yellow white coloration.

## 2017-08-30 ENCOUNTER — Ambulatory Visit (INDEPENDENT_AMBULATORY_CARE_PROVIDER_SITE_OTHER): Payer: Self-pay | Admitting: General Surgery

## 2017-08-30 ENCOUNTER — Encounter: Payer: Self-pay | Admitting: General Surgery

## 2017-08-30 VITALS — BP 127/80 | HR 65 | Temp 98.6°F | Resp 16 | Wt 105.0 lb

## 2017-08-30 DIAGNOSIS — D219 Benign neoplasm of connective and other soft tissue, unspecified: Secondary | ICD-10-CM

## 2017-08-30 NOTE — Patient Instructions (Signed)
Steri strip will fall off in about 5-7 days. If it falls off sooner, that is ok.  Wait about 10 days until putting any creams/ neosporin on the scar.

## 2017-08-30 NOTE — Progress Notes (Signed)
[facility] Note   HPI:  22 y.o. Female presents to clinic for post-op follow-up after an excision of a left neck mass. 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 She has been doing well and reports no issues.   Review of Systems:  No drainage No fevers Dressing in place  All other review of systems: otherwise negative   Pathology: Diagnosis Soft tissue mass, biopsy, left side of neck - BENIGN GRANULAR CELL TUMOR, 1.1 CM.   Vital Signs:  BP 127/80 (BP Location: Left Arm, Patient Position: Sitting, Cuff Size: Normal)   Pulse 65   Temp 98.6 F (37 C) (Temporal)   Resp 16   Wt 105 lb (47.6 kg)   LMP 08/23/2017 Comment: for 1 month and a half  BMI 19.84 kg/m    Physical Exam:  Physical Exam  Constitutional: She appears well-developed.  HENT:  Head: Normocephalic.  Eyes: Pupils are equal, round, and reactive to light.  Neck: Normal range of motion.  Left neck incision, dressing removed, sutures in place and removed, steri strips applied  Cardiovascular: Normal rate and regular rhythm.  Vitals reviewed.   Laboratory studies: None   Imaging:  None    Assessment:  22 y.o. 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 Doing well and stitches removed.  Plan:  - Keep steri in place until peeling off  - Shower per routine  - Can do ointment/ neosporin in about 10 days  - Follow up PRN   All of the above recommendations were discussed with the patient and patient's family, and all of patient's and family's questions were answered to his/her/their expressed satisfaction.  Curlene Labrum, MD [facility] [address] [place], [place] [address] [phone] (office)

## 2017-09-05 ENCOUNTER — Ambulatory Visit (INDEPENDENT_AMBULATORY_CARE_PROVIDER_SITE_OTHER): Payer: BLUE CROSS/BLUE SHIELD | Admitting: Adult Health

## 2017-09-05 ENCOUNTER — Encounter: Payer: Self-pay | Admitting: Adult Health

## 2017-09-05 VITALS — BP 127/86 | HR 74 | Ht 60.0 in | Wt 105.5 lb

## 2017-09-05 DIAGNOSIS — N926 Irregular menstruation, unspecified: Secondary | ICD-10-CM

## 2017-09-05 DIAGNOSIS — Z3009 Encounter for other general counseling and advice on contraception: Secondary | ICD-10-CM | POA: Diagnosis not present

## 2017-09-05 DIAGNOSIS — Z113 Encounter for screening for infections with a predominantly sexual mode of transmission: Secondary | ICD-10-CM

## 2017-09-05 NOTE — Patient Instructions (Signed)
NO SEX

## 2017-09-05 NOTE — Progress Notes (Signed)
  Subjective:     Patient ID: Sydney Higgins, female   DOB: 1995/12/16, 22 y.o.   MRN: [id number]  HPI Sydney Higgins is a 22 year old black female in to discuss changing from depo to nexplanon.She has had some irregular bleeding on and off since May.Had normal [place] 08/15/17.   Review of Systems +irregular bleeding since May Has stopped depo Last sex yesterday Reviewed past medical,surgical, social and family history. Reviewed medications and allergies.     Objective:   Physical Exam BP 127/86 (BP Location: Left Arm, Patient Position: Sitting, Cuff Size: Normal)   Pulse 74   Ht 5' (1.524 m)   Wt 105 lb 8 oz (47.9 kg)   LMP 08/23/2017 Comment: ongoing bleeding  BMI 20.60 kg/m Skin warm and dry. Lungs: clear to ausculation bilaterally. Cardiovascular: regular rate and rhythm.   Discussed nexplanon and she wants to get it.  Assessment:     1. Irregular bleeding   2. Contraceptive education   3. Screening examination for STD (sexually transmitted disease)       Plan:    GC/CHL sent on urine. No sex Return in 2 weeks for stat [facility] in am and then nexplanon insertion in pm with me, Angie to check insurance ion nexplanon, review handout on nexplanon

## 2017-09-08 LAB — GC/CHLAMYDIA PROBE AMP
Chlamydia trachomatis, NAA: NEGATIVE
NEISSERIA GONORRHOEAE BY PCR: NEGATIVE

## 2017-09-19 ENCOUNTER — Other Ambulatory Visit: Payer: BLUE CROSS/BLUE SHIELD

## 2017-09-19 ENCOUNTER — Encounter: Payer: BLUE CROSS/BLUE SHIELD | Admitting: Adult Health

## 2017-10-03 ENCOUNTER — Ambulatory Visit: Payer: BLUE CROSS/BLUE SHIELD | Admitting: Adult Health

## 2017-10-03 ENCOUNTER — Encounter: Payer: Self-pay | Admitting: Adult Health

## 2017-10-03 ENCOUNTER — Other Ambulatory Visit: Payer: BLUE CROSS/BLUE SHIELD

## 2017-10-03 VITALS — BP 127/81 | HR 79 | Ht 61.0 in | Wt 112.4 lb

## 2017-10-03 DIAGNOSIS — Z3046 Encounter for surveillance of implantable subdermal contraceptive: Secondary | ICD-10-CM

## 2017-10-03 DIAGNOSIS — Z30017 Encounter for initial prescription of implantable subdermal contraceptive: Secondary | ICD-10-CM

## 2017-10-03 LAB — BETA HCG QUANT (REF LAB)

## 2017-10-03 MED ORDER — ETONOGESTREL 68 MG ~~LOC~~ IMPL
68.0000 mg | DRUG_IMPLANT | Freq: Once | SUBCUTANEOUS | Status: AC
  Administered 2017-10-03: 68 mg via SUBCUTANEOUS

## 2017-10-03 NOTE — Patient Instructions (Signed)
Use condoms x 2 weeks, keep clean and dry x 24 hours, no heavy lifting, keep steri strips on x 72 hours, Keep pressure dressing on x 24 hours. Follow up prn problems. Pap and physical in 1 year

## 2017-10-03 NOTE — Addendum Note (Signed)
Addended by: Diona Fanti A on: 10/03/2017 05:01 PM   Modules accepted: Orders

## 2017-10-03 NOTE — Progress Notes (Signed)
  Subjective:     Patient ID: Sydney Higgins, female   DOB: [birth year], 22 y.o.   MRN: [id number]  HPI Sydney Higgins is a 22 year old black female in for nexplanon insertion.  Review of Systems +irregular bleeding with depo, so stopped it. Reviewed past medical,surgical, social and family history. Reviewed medications and allergies.     Objective:   Physical Exam BP 127/81 (BP Location: Left Arm, Patient Position: Sitting, Cuff Size: Small)   Pulse 79   Ht 5\' 1"  (1.549 m)   Wt 112 lb 6.4 oz (51 kg)   BMI 21.24 kg/m   QHCG<1 this am. Consent signed, time out called. Left arm cleansed with betadine, and injected with 1.0 cc 1% lidocaine and waited til numb. Nexplanon easily inserted and steri strips applied.Rod easily palpated by provider and pt. Pressure dressing applied.    Assessment:     Nexplanon insertion, lot # [id number] exp 12/1019    Plan:    Use condoms x 2 weeks, keep clean and dry x 24 hours, no heavy lifting, keep steri strips on x 72 hours, Keep pressure dressing on x 24 hours. Follow up prn problems. Pap and physical in 1 year

## 2018-05-24 ENCOUNTER — Telehealth: Payer: Self-pay | Admitting: Adult Health

## 2018-05-24 NOTE — Telephone Encounter (Signed)
Called patient back and left voicemail that I am returning her call.  ?

## 2018-05-24 NOTE — Telephone Encounter (Signed)
Pt has some concerns on the nexplanon/ did home preg test which was neg has had nexplanon July of last year. She has lost her ins so if we could just talk to her without appt she would love that

## 2018-08-22 ENCOUNTER — Encounter: Payer: Self-pay | Admitting: *Deleted

## 2018-08-23 ENCOUNTER — Other Ambulatory Visit: Payer: Self-pay

## 2018-08-23 ENCOUNTER — Ambulatory Visit: Payer: Self-pay | Admitting: Adult Health

## 2018-08-23 ENCOUNTER — Encounter: Payer: Self-pay | Admitting: Adult Health

## 2018-08-23 VITALS — BP 127/75 | HR 56 | Temp 98.7°F | Ht 60.5 in | Wt 109.0 lb

## 2018-08-23 DIAGNOSIS — Z3046 Encounter for surveillance of implantable subdermal contraceptive: Secondary | ICD-10-CM

## 2018-08-23 MED ORDER — PRENATAL PLUS 27-1 MG PO TABS: 1.0000 | ORAL_TABLET | Freq: Every day | ORAL | 12 refills | Status: DC

## 2018-08-23 NOTE — Patient Instructions (Signed)
Use condoms x 2 weeks, keep clean and dry x 24 hours, no heavy lifting, keep steri strips on x 72 hours, Keep pressure dressing on x 24 hours. Follow up prn problems. Pap and physical in July

## 2018-08-23 NOTE — Progress Notes (Signed)
Patient ID: Sydney Higgins, female   DOB: 08-29-1995, 23 y.o.   MRN: [id number] History of Present Illness: Sydney Higgins is a 23 year old black female, G0P0 in for nexplanon removal. She wants to get pregnant.  PCP is Dr Cindie Laroche.    Current Medications, Allergies, Past Medical History, Past Surgical History, Family History and Social History were reviewed in [employer] record.     Review of Systems: For nexplanon removal Wants to get pregnant     Physical Exam:BP 127/75 (BP Location: Left Arm, Patient Position: Sitting, Cuff Size: Normal)   Pulse (!) 56   Temp 98.7 F (37.1 C)   Ht 5' 0.5" (1.537 m)   Wt 109 lb (49.4 kg)   BMI 20.94 kg/m  General:  Well developed, well nourished, no acute distress Skin:  Warm and dry  Psych:  No mood changes, alert and cooperative,seems happy Fall risk is low. Consent signed and time out called. Left arm cleansed with betadine, and injected with 1 cc 2% lidocaine and waited til numb.Under sterile technique a #11 blade was used to make small vertical incision, and fingers was used to easily remove rod. Steri strips applied. Pressure dressing applied. She is aware could get pregnant first time of unprotected sex, or could take 6-18 months of active trying.   Impression: Nexplanon removal     Plan: Use condoms x 2 weeks, keep clean and dry x 24 hours, no heavy lifting, keep steri strips on x 72 hours, Keep pressure dressing on x 24 hours. Follow up prn problems. Will Rx PNV Meds ordered this encounter  Medications  . prenatal vitamin w/FE, FA (PRENATAL 1 + 1) 27-1 MG TABS tablet    Sig: Take 1 tablet by mouth daily at 12 noon.    Dispense:  30 each    Refill:  12    Order Specific Question:   Supervising Provider    Answer:   Florian Buff [[id number]]  Return in July for pap and physical
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
## 2018-10-13 ENCOUNTER — Other Ambulatory Visit: Payer: Self-pay | Admitting: Adult Health

## 2019-01-31 ENCOUNTER — Ambulatory Visit: Payer: Self-pay | Admitting: Adult Health

## 2019-01-31 ENCOUNTER — Other Ambulatory Visit: Payer: Self-pay

## 2019-01-31 ENCOUNTER — Encounter: Payer: Self-pay | Admitting: Adult Health

## 2019-01-31 VITALS — BP 127/87 | HR 60 | Ht 60.0 in | Wt 100.0 lb

## 2019-01-31 DIAGNOSIS — Z3202 Encounter for pregnancy test, result negative: Secondary | ICD-10-CM | POA: Insufficient documentation

## 2019-01-31 DIAGNOSIS — Z319 Encounter for procreative management, unspecified: Secondary | ICD-10-CM

## 2019-01-31 DIAGNOSIS — F329 Major depressive disorder, single episode, unspecified: Secondary | ICD-10-CM

## 2019-01-31 DIAGNOSIS — F32A Depression, unspecified: Secondary | ICD-10-CM

## 2019-01-31 LAB — POCT URINE PREGNANCY: Preg Test, Ur: NEGATIVE

## 2019-01-31 NOTE — Progress Notes (Signed)
  Subjective:     Patient ID: Sydney Higgins, female   DOB: 08-02-1995, 23 y.o.   MRN: [id number]  HPI Sydney Higgins is a 23 year old black female, single G2P0020, in to discuss getting pregnant  PCP is Dr Cindie Laroche.   Review of Systems Periods heavier since nexplanon removed Has been trying to get pregnant since nexplanon removed  Reviewed past medical,surgical, social and family history. Reviewed medications and allergies.     Objective:   Physical Exam BP 127/87 (BP Location: Left Arm, Patient Position: Sitting, Cuff Size: Normal)   Pulse 60   Ht 5' (1.524 m)   Wt 100 lb (45.4 kg)   LMP 01/29/2019 (Exact Date)   BMI 19.53 kg/m UPT is negative Skin warm and dry. Neck: mid line trachea, normal thyroid, good ROM, no lymphadenopathy noted. Lungs: clear to ausculation bilaterally. Cardiovascular: regular rate and rhythm. Exam by Weyman Croon FNP student Fall risk is low PHQ 9 score is 14, denies being suicidal, and declines meds for now.    Assessment:     1. Patient desires pregnancy   2. Urine pregnancy test negative   3. Depression, unspecified depression type       Plan:     Continue PNV,has refills Check progesterone level 11/23.2020 Return 11/13 for pap and physical

## 2019-02-14 ENCOUNTER — Other Ambulatory Visit (HOSPITAL_COMMUNITY)
Admission: RE | Admit: 2019-02-14 | Discharge: 2019-02-14 | Disposition: A | Discharge status: Home / Self Care | Payer: Self-pay | Source: Ambulatory Visit | Attending: Adult Health | Admitting: Adult Health

## 2019-02-14 ENCOUNTER — Ambulatory Visit (INDEPENDENT_AMBULATORY_CARE_PROVIDER_SITE_OTHER): Payer: Self-pay | Admitting: Adult Health

## 2019-02-14 ENCOUNTER — Other Ambulatory Visit: Payer: Self-pay

## 2019-02-14 ENCOUNTER — Encounter: Payer: Self-pay | Admitting: Adult Health

## 2019-02-14 VITALS — BP 139/92 | HR 66 | Ht 60.0 in | Wt 102.0 lb

## 2019-02-14 DIAGNOSIS — F32A Depression, unspecified: Secondary | ICD-10-CM

## 2019-02-14 DIAGNOSIS — Z01419 Encounter for gynecological examination (general) (routine) without abnormal findings: Secondary | ICD-10-CM

## 2019-02-14 DIAGNOSIS — Z319 Encounter for procreative management, unspecified: Secondary | ICD-10-CM

## 2019-02-14 DIAGNOSIS — F329 Major depressive disorder, single episode, unspecified: Secondary | ICD-10-CM

## 2019-02-14 MED ORDER — ESCITALOPRAM OXALATE 10 MG PO TABS: 10.0000 mg | ORAL_TABLET | Freq: Every day | ORAL | 6 refills | Status: DC

## 2019-02-14 NOTE — Progress Notes (Signed)
Patient ID: Sydney Higgins, female   DOB: 1995-11-23, 23 y.o.   MRN: [id number] History of Present Illness: Sydney Higgins is a 23 year old black female, engaged, G2P0020, in for a well woman gyn exam and first pap.Wants to get pregnant.  PCP is Dr Cindie Laroche.   Current Medications, Allergies, Past Medical History, Past Surgical History, Family History and Social History were reviewed in [employer] record.     Review of Systems: Patient denies any headaches, hearing loss, fatigue, blurred vision, shortness of breath, chest pain, abdominal pain, problems with bowel movements, urination, or intercourse. No joint pain or mood swings.    Physical Exam:BP (!) 139/92 (BP Location: Left Arm, Patient Position: Sitting, Cuff Size: Normal)   Pulse 66   Ht 5' (1.524 m)   Wt 102 lb (46.3 kg)   LMP 01/29/2019 (Exact Date)   BMI 19.92 kg/m  General:  Well developed, well nourished, no acute distress Skin:  Warm and dry Neck:  Midline trachea, normal thyroid, good ROM, no lymphadenopathy Lungs; Clear to auscultation bilaterally Breast:  No dominant palpable mass, retraction, or nipple discharge Cardiovascular: Regular rate and rhythm Abdomen:  Soft, non tender, no hepatosplenomegaly Pelvic:  External genitalia is normal in appearance, no lesions.  The vagina is normal in appearance. Urethra has no lesions or masses. The cervix is nulliparous, pap with GC/CHL performed by Weyman Croon FNP student, under my supervision.  Uterus is felt to be normal size, shape, and contour.  No adnexal masses or tenderness noted.Bladder is non tender, no masses felt. Extremities/musculoskeletal:  No swelling or varicosities noted, no clubbing or cyanosis Psych:  No mood changes, alert and cooperative,seems happy Fall risk is low PHQ 9 score is 13, denies being suicidal, but has thoughts at times, but no plans, does not want to harm self, and is open to meds now, will rx lexapro  Impression and Plan: 1.  Encounter for gynecological examination with Papanicolaou smear of cervix Pap sent Papin 3 if normal Physical in 1 year  2. Patient desires pregnancy Get progesterone level 02/19/19  3. Depression, unspecified depression type Will Rx lexapro Meds ordered this encounter  Medications  . escitalopram (LEXAPRO) 10 MG tablet    Sig: Take 1 tablet (10 mg total) by mouth daily.    Dispense:  30 tablet    Refill:  6    Order Specific Question:   Supervising Provider    Answer:   Tania Ade H [[id number]]

## 2019-02-16 LAB — CYTOLOGY - PAP
Chlamydia: NEGATIVE
Comment: NEGATIVE
Comment: NORMAL
Neisseria Gonorrhea: NEGATIVE

## 2019-02-19 ENCOUNTER — Encounter: Payer: Self-pay | Admitting: Adult Health

## 2019-02-19 ENCOUNTER — Other Ambulatory Visit: Payer: Self-pay

## 2019-02-19 ENCOUNTER — Other Ambulatory Visit: Payer: Self-pay | Admitting: Adult Health

## 2019-02-19 DIAGNOSIS — Z319 Encounter for procreative management, unspecified: Secondary | ICD-10-CM

## 2019-02-19 DIAGNOSIS — R87612 Low grade squamous intraepithelial lesion on cytologic smear of cervix (LGSIL): Secondary | ICD-10-CM

## 2019-02-19 HISTORY — DX: Low grade squamous intraepithelial lesion on cytologic smear of cervix (LGSIL): R87.612

## 2019-02-19 NOTE — Progress Notes (Signed)
Ck progesterone level 

## 2019-02-20 LAB — PROGESTERONE: Progesterone: 7.3 ng/mL

## 2019-02-26 ENCOUNTER — Encounter (HOSPITAL_COMMUNITY): Payer: Self-pay | Admitting: Emergency Medicine

## 2019-02-26 ENCOUNTER — Telehealth: Payer: Self-pay | Admitting: *Deleted

## 2019-02-26 ENCOUNTER — Other Ambulatory Visit: Payer: Self-pay

## 2019-02-26 ENCOUNTER — Emergency Department (HOSPITAL_COMMUNITY)
Admission: EM | Admit: 2019-02-26 | Discharge: 2019-02-26 | Disposition: A | Discharge status: Home / Self Care | Payer: Self-pay | Attending: Emergency Medicine | Admitting: Emergency Medicine

## 2019-02-26 DIAGNOSIS — Z79899 Other long term (current) drug therapy: Secondary | ICD-10-CM | POA: Insufficient documentation

## 2019-02-26 DIAGNOSIS — F1210 Cannabis abuse, uncomplicated: Secondary | ICD-10-CM | POA: Insufficient documentation

## 2019-02-26 DIAGNOSIS — R110 Nausea: Secondary | ICD-10-CM | POA: Insufficient documentation

## 2019-02-26 DIAGNOSIS — Z3202 Encounter for pregnancy test, result negative: Secondary | ICD-10-CM

## 2019-02-26 DIAGNOSIS — K5900 Constipation, unspecified: Secondary | ICD-10-CM | POA: Insufficient documentation

## 2019-02-26 DIAGNOSIS — Z87891 Personal history of nicotine dependence: Secondary | ICD-10-CM | POA: Insufficient documentation

## 2019-02-26 LAB — POC URINE PREG, ED: Preg Test, Ur: NEGATIVE

## 2019-02-26 NOTE — Discharge Instructions (Addendum)
Continue prenatal vitamins Please follow up with your OBGYN

## 2019-02-26 NOTE — ED Triage Notes (Signed)
Pt desires pregnancy. 7 Days ago had bloodwork done showing she was ovulating. Is concerned she may be having a miscarriage due to bleeding starting today. Is time for her normal cycle as LMP 01/29/2019. States she had a positive home test yesterday with a faint line.

## 2019-02-26 NOTE — Telephone Encounter (Signed)
Patient had called at 9:09 am stating she took a pregnancy test yesterday that was positive. This morning she was having heavy bleeding but no cramps or pain. She needs to know if she needs to make an appointment. Per chart review patient went to ED and needs to follow up with physician.

## 2019-02-26 NOTE — ED Provider Notes (Signed)
[facility] EMERGENCY DEPARTMENT Provider Note   CSN: [id number] Arrival date & time: 02/26/19  [id number]     History   Chief Complaint Chief Complaint  Patient presents with  . Vaginal Bleeding    HPI Sydney Higgins is a 23 y.o. G2P0 female who presents for a pregnancy test.  The patient states that she is trying to get pregnant.  She has been followed by OB/GYN and had progesterone levels tested on November 23rd.  She was told that she was ovulating.  She started to have vaginal bleeding yesterday as well as nausea and constipation.  She states that her period is early and usually it will start at the beginning of the month.  She took a pregnancy test yesterday and it was positive and she took another one today and there was a faint positive line.  She states that she feels confused because normally with her menstrual cycle she will get really bad cramping and mood swings and she has not had that this time.  The bleeding has also been somewhat irregular.  Usually she will have a normal flow and this time she will have bleeding and then spotting.  HPI  Past Medical History:  Diagnosis Date  . Anxiety   . 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 DMDD (disruptive mood dysregulation disorder) ([place]) 12/11/2015  . Headache   . Insomnia 12/11/2015  . 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 Encounter for gynecological examination with Papanicolaou smear of cervix 02/14/2019  . Patient desires pregnancy 01/31/2019  . Urine pregnancy test negative 01/31/2019  . Contraceptive education 09/05/2017  . Irregular bleeding 09/05/2017  . 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 Mass of left side of neck   . Depression 01/11/2017  . Breast pain 01/11/2017  . Weight loss 01/11/2017  . Abnormal uterine bleeding (AUB) 01/11/2017  . Screening examination for STD  (sexually transmitted disease) 01/11/2017  . Constipation 12/17/2015  . MDD (major depressive disorder), recurrent episode, severe ([place]) 12/11/2015  . 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 DMDD (disruptive mood dysregulation disorder) ([place]) 12/11/2015  . Insomnia 12/11/2015    Past Surgical History:  Procedure Laterality Date  . MASS EXCISION Left 08/26/2017   Procedure: EXCISION SUPERFICIAL MASS FROM LEFT SIDE OF NECK;  Surgeon: Virl Cagey, MD;  Location: [facility];  Service: General;  Laterality: Left;     OB History    Gravida  2   Para      Term      Preterm      AB  2   Living  0     SAB  2   TAB      Ectopic      Multiple      Live Births               Home Medications    Prior to Admission medications   Medication Sig Start Date End Date Taking? Authorizing Provider  amphetamine-dextroamphetamine (ADDERALL) 20 MG tablet Take 20 mg by mouth daily.    [provider]  escitalopram (LEXAPRO) 10 MG tablet Take 1 tablet (10 mg total) by mouth daily. 02/14/19 02/14/20  Estill Dooms, NP  ibuprofen (ADVIL,MOTRIN) 200 MG tablet Take 400 mg by mouth every 8 (eight) hours as needed.  [provider]  prenatal vitamin w/FE, FA (PRENATAL 1 + 1) 27-1 MG TABS tablet Take 1 tablet by mouth daily at 12 noon. 08/23/18   Estill Dooms, NP    Family History Family History  Problem Relation Age of Onset  . Cancer Paternal Grandfather   . Cancer Paternal Grandmother   . Diabetes Maternal Grandmother   . Diabetes Maternal Grandfather   . Hypertension Father   . Bipolar disorder Brother   . Deafness Sister     Social History Social History   Tobacco Use  . Smoking status: Former Smoker    Types: E-cigarettes  . Smokeless tobacco: Never Used  Substance Use Topics  . Alcohol use: Not Currently    Frequency: Never  . Drug use: Yes    Types: Marijuana    Comment: occ     Allergies   Apple, Banana, Bee venom, and  Pear   Review of Systems Review of Systems  Gastrointestinal: Positive for constipation and nausea.  Genitourinary: Positive for vaginal bleeding. Negative for pelvic pain and vaginal discharge.     Physical Exam Updated Vital Signs BP 118/90 (BP Location: Right Arm)   Pulse 78   Temp 98 F (36.7 C) (Oral)   Resp 16   Ht 5' (1.524 m)   Wt 46.7 kg   LMP 01/29/2019 (Exact Date)   SpO2 100%   BMI 20.12 kg/m   Physical Exam Vitals signs and nursing note reviewed.  Constitutional:      General: She is not in acute distress.    Appearance: Normal appearance. She is well-developed. She is not ill-appearing.  HENT:     Head: Normocephalic and atraumatic.  Eyes:     General: No scleral icterus.       Right eye: No discharge.        Left eye: No discharge.     Conjunctiva/sclera: Conjunctivae normal.     Pupils: Pupils are equal, round, and reactive to light.  Neck:     Musculoskeletal: Normal range of motion.  Cardiovascular:     Rate and Rhythm: Normal rate and regular rhythm.  Pulmonary:     Effort: Pulmonary effort is normal. No respiratory distress.     Breath sounds: Normal breath sounds.  Abdominal:     General: There is no distension.     Palpations: Abdomen is soft.     Tenderness: There is no abdominal tenderness.  Skin:    General: Skin is warm and dry.  Neurological:     Mental Status: She is alert and oriented to person, place, and time.  Psychiatric:        Behavior: Behavior normal.      ED Treatments / Results  Labs (all labs ordered are listed, but only abnormal results are displayed) Labs Reviewed  POC URINE PREG, ED    EKG None  Radiology No results found.  Procedures Procedures (including critical care time)  Medications Ordered in ED Medications - No data to display   Initial Impression / Assessment and Plan / ED Course  I have reviewed the triage vital signs and the nursing notes.  Pertinent labs & imaging results that were  available during my care of the patient were reviewed by me and considered in my medical decision making (see chart for details).  23 year old female presents for a pregnancy test.  Her vitals are normal.  Her abdomen is soft and nontender.  She has no pelvic tenderness.  Pelvic exam deferred today.  Urine pregnancy test is negative.  Results were discussed with patient.  Encouraged her to follow-up with OB/GYN  Final Clinical Impressions(s) / ED Diagnoses   Final diagnoses:  Pregnancy examination or test, negative result    ED Discharge Orders    None       Recardo Evangelist, PA-C 02/26/19 1230    Fredia Sorrow, MD 02/28/19 [id number]

## 2019-05-16 ENCOUNTER — Other Ambulatory Visit: Payer: Self-pay

## 2019-05-16 ENCOUNTER — Encounter (HOSPITAL_COMMUNITY): Payer: Self-pay | Admitting: *Deleted

## 2019-05-16 ENCOUNTER — Emergency Department (HOSPITAL_COMMUNITY)
Admission: EM | Admit: 2019-05-16 | Discharge: 2019-05-17 | Disposition: A | Discharge status: Home / Self Care | Payer: Self-pay | Attending: Emergency Medicine | Admitting: Emergency Medicine

## 2019-05-16 DIAGNOSIS — F331 Major depressive disorder, recurrent, moderate: Secondary | ICD-10-CM | POA: Insufficient documentation

## 2019-05-16 DIAGNOSIS — Z79899 Other long term (current) drug therapy: Secondary | ICD-10-CM | POA: Insufficient documentation

## 2019-05-16 DIAGNOSIS — Z87891 Personal history of nicotine dependence: Secondary | ICD-10-CM | POA: Insufficient documentation

## 2019-05-16 DIAGNOSIS — F329 Major depressive disorder, single episode, unspecified: Secondary | ICD-10-CM

## 2019-05-16 DIAGNOSIS — R45851 Suicidal ideations: Secondary | ICD-10-CM | POA: Insufficient documentation

## 2019-05-16 DIAGNOSIS — F32A Depression, unspecified: Secondary | ICD-10-CM

## 2019-05-16 DIAGNOSIS — R1011 Right upper quadrant pain: Secondary | ICD-10-CM | POA: Insufficient documentation

## 2019-05-16 LAB — CBC WITH DIFFERENTIAL/PLATELET
Abs Immature Granulocytes: 0.02 10*3/uL (ref 0.00–0.07)
Basophils Absolute: 0.1 10*3/uL (ref 0.0–0.1)
Basophils Relative: 1 %
Eosinophils Absolute: 0 10*3/uL (ref 0.0–0.5)
Eosinophils Relative: 0 %
HCT: 40.9 % (ref 36.0–46.0)
Hemoglobin: 13.2 g/dL (ref 12.0–15.0)
Immature Granulocytes: 0 %
Lymphocytes Relative: 29 %
Lymphs Abs: 2.3 10*3/uL (ref 0.7–4.0)
MCH: 28.6 pg (ref 26.0–34.0)
MCHC: 32.3 g/dL (ref 30.0–36.0)
MCV: 88.5 fL (ref 80.0–100.0)
Monocytes Absolute: 0.7 10*3/uL (ref 0.1–1.0)
Monocytes Relative: 8 %
Neutro Abs: 4.8 10*3/uL (ref 1.7–7.7)
Neutrophils Relative %: 62 %
Platelets: 213 10*3/uL (ref 150–400)
RBC: 4.62 MIL/uL (ref 3.87–5.11)
RDW: 13.2 % (ref 11.5–15.5)
WBC: 7.8 10*3/uL (ref 4.0–10.5)
nRBC: 0 % (ref 0.0–0.2)

## 2019-05-16 LAB — COMPREHENSIVE METABOLIC PANEL
ALT: 17 U/L (ref 0–44)
AST: 21 U/L (ref 15–41)
Albumin: 4.5 g/dL (ref 3.5–5.0)
Alkaline Phosphatase: 50 U/L (ref 38–126)
Anion gap: 11 (ref 5–15)
BUN: 12 mg/dL (ref 6–20)
CO2: 23 mmol/L (ref 22–32)
Calcium: 9.5 mg/dL (ref 8.9–10.3)
Chloride: 102 mmol/L (ref 98–111)
Creatinine, Ser: 0.81 mg/dL (ref 0.44–1.00)
GFR calc Af Amer: >60 mL/min (ref >60)
GFR calc non Af Amer: >60 mL/min (ref >60)
Glucose, Bld: 98 mg/dL (ref 70–99)
Potassium: 3.9 mmol/L (ref 3.5–5.1)
Sodium: 136 mmol/L (ref 135–145)
Total Bilirubin: 0.7 mg/dL (ref 0.3–1.2)
Total Protein: 8.1 g/dL (ref 6.5–8.1)

## 2019-05-16 LAB — RAPID URINE DRUG SCREEN, HOSP PERFORMED
Amphetamines: NOT DETECTED
Barbiturates: NOT DETECTED
Benzodiazepines: NOT DETECTED
Cocaine: NOT DETECTED
Opiates: NOT DETECTED
Tetrahydrocannabinol: POSITIVE — AB

## 2019-05-16 LAB — URINALYSIS, ROUTINE W REFLEX MICROSCOPIC
Bilirubin Urine: NEGATIVE
Glucose, UA: NEGATIVE mg/dL
Hgb urine dipstick: NEGATIVE
Ketones, ur: 20 mg/dL — AB
Leukocytes,Ua: NEGATIVE
Nitrite: NEGATIVE
Protein, ur: 30 mg/dL — AB
Specific Gravity, Urine: 1.024 (ref 1.005–1.030)
pH: 6 (ref 5.0–8.0)

## 2019-05-16 LAB — HCG, QUANTITATIVE, PREGNANCY: hCG, Beta Chain, Quant, S: <1 m[IU]/mL (ref <5)

## 2019-05-16 LAB — LIPASE, BLOOD: Lipase: 26 U/L (ref 11–51)

## 2019-05-16 LAB — SALICYLATE LEVEL: Salicylate Lvl: <7 mg/dL — ABNORMAL LOW (ref 7.0–30.0)

## 2019-05-16 LAB — ACETAMINOPHEN LEVEL: Acetaminophen (Tylenol), Serum: <10 ug/mL — ABNORMAL LOW (ref 10–30)

## 2019-05-16 LAB — ETHANOL: Alcohol, Ethyl (B): <10 mg/dL (ref <10)

## 2019-05-16 MED ORDER — ESCITALOPRAM OXALATE 10 MG PO TABS
10.0000 mg | ORAL_TABLET | Freq: Every day | ORAL | Status: DC
  Administered 2019-05-17: 06:00:00 10 mg via ORAL
  Filled 2019-05-16: qty 1

## 2019-05-16 MED ORDER — ACETAMINOPHEN 500 MG PO TABS: 500.0000 mg | ORAL_TABLET | ORAL | Status: DC | PRN

## 2019-05-16 MED ORDER — ONDANSETRON HCL 4 MG PO TABS: 4.0000 mg | ORAL_TABLET | Freq: Three times a day (TID) | ORAL | Status: DC | PRN

## 2019-05-16 MED ORDER — ZOLPIDEM TARTRATE 5 MG PO TABS: 5.0000 mg | ORAL_TABLET | Freq: Every evening | ORAL | Status: DC | PRN

## 2019-05-16 MED ORDER — NICOTINE 21 MG/24HR TD PT24: 21.0000 mg | MEDICATED_PATCH | Freq: Every day | TRANSDERMAL | Status: DC

## 2019-05-16 MED ORDER — AMPHETAMINE-DEXTROAMPHETAMINE 10 MG PO TABS: 20.0000 mg | ORAL_TABLET | Freq: Every day | ORAL | Status: DC

## 2019-05-16 MED ORDER — ALUM & MAG HYDROXIDE-SIMETH 200-200-20 MG/5ML PO SUSP: 30.0000 mL | Freq: Four times a day (QID) | ORAL | Status: DC | PRN
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
## 2019-05-17 MED ORDER — ESCITALOPRAM OXALATE 10 MG PO TABS: 10.0000 mg | ORAL_TABLET | Freq: Every day | ORAL | 0 refills | Status: DC

## 2019-05-17 NOTE — BH Assessment (Signed)
[facility] Assessment Progress Note   Clinician talked with nurse Dietrich Pates who said that they do have "no harm" contracts there at [facility].  Clinician talked to Dr. Rolland Porter and let her know about recommendation that patient sign a no harm contract and discharge.  Patient contract should be faxed to [facility] at [phone].

## 2019-05-17 NOTE — ED Notes (Signed)
Pt asking if she will be arrested if she leaves- informed pt that Dr Tomi Bamberger will take out IVC papers if pt attempts to leave. Pt verbalized understanding. PT requesting to use phone- pt given phone with limit of 5 minutes for phone call.
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
## 2019-05-17 NOTE — Discharge Instructions (Addendum)
You need to follow-up with [facility] or [facility] to continue treatment for your depression and to continue on your Lexapro.  Avoid fried, spicy, or greasy foods until you get the results of your ultrasound test to check you for gallstones.  You will need to call to get the test scheduled.

## 2019-05-17 NOTE — ED Provider Notes (Addendum)
5:30 AM [name], TTS has evaluated patient and discussed with his PA.  They feel she can be discharged if she signed a no harm contract stating she will follow up with either [facility] or [facility] where she is gone in the past.  5:45 AM patient signed a no harm contract.  She is requesting a prescription for Lexapro, she states her mother is not talking to her and will not give her her medications.  Patient had a right upper quadrant ultrasound ordered, it was changed to outpatient.   Rolland Porter, MD 05/17/19 [id number]    Rolland Porter, MD 05/17/19 [phone]

## 2019-05-19 ENCOUNTER — Inpatient Hospital Stay (HOSPITAL_COMMUNITY)
Admission: AD | Admit: 2019-05-19 | Discharge: 2019-05-24 | DRG: 885 | Disposition: A | Discharge status: Home / Self Care | Payer: Federal, State, Local not specified - Other | Source: Intra-hospital | Attending: Psychiatry | Admitting: Psychiatry

## 2019-05-19 ENCOUNTER — Emergency Department (HOSPITAL_COMMUNITY): Payer: Self-pay

## 2019-05-19 ENCOUNTER — Emergency Department (HOSPITAL_COMMUNITY)
Admission: EM | Admit: 2019-05-19 | Discharge: 2019-05-19 | Disposition: A | Discharge status: Home / Self Care | Payer: Self-pay | Attending: Emergency Medicine | Admitting: Emergency Medicine

## 2019-05-19 ENCOUNTER — Encounter (HOSPITAL_COMMUNITY): Payer: Self-pay | Admitting: Adult Health

## 2019-05-19 ENCOUNTER — Encounter (HOSPITAL_COMMUNITY): Payer: Self-pay | Admitting: Psychiatry

## 2019-05-19 ENCOUNTER — Encounter (HOSPITAL_COMMUNITY): Payer: Self-pay | Admitting: Emergency Medicine

## 2019-05-19 ENCOUNTER — Other Ambulatory Visit: Payer: Self-pay

## 2019-05-19 ENCOUNTER — Ambulatory Visit (HOSPITAL_COMMUNITY)
Admission: AD | Admit: 2019-05-19 | Discharge: 2019-05-19 | Disposition: A | Discharge status: Home / Self Care | Payer: Federal, State, Local not specified - Other | Attending: Psychiatry | Admitting: Psychiatry

## 2019-05-19 DIAGNOSIS — K5900 Constipation, unspecified: Secondary | ICD-10-CM | POA: Insufficient documentation

## 2019-05-19 DIAGNOSIS — Z590 Homelessness: Secondary | ICD-10-CM | POA: Insufficient documentation

## 2019-05-19 DIAGNOSIS — F1290 Cannabis use, unspecified, uncomplicated: Secondary | ICD-10-CM | POA: Diagnosis present

## 2019-05-19 DIAGNOSIS — F4320 Adjustment disorder, unspecified: Secondary | ICD-10-CM | POA: Insufficient documentation

## 2019-05-19 DIAGNOSIS — Z915 Personal history of self-harm: Secondary | ICD-10-CM | POA: Diagnosis not present

## 2019-05-19 DIAGNOSIS — F410 Panic disorder [episodic paroxysmal anxiety] without agoraphobia: Secondary | ICD-10-CM | POA: Diagnosis present

## 2019-05-19 DIAGNOSIS — G4700 Insomnia, unspecified: Secondary | ICD-10-CM | POA: Diagnosis present

## 2019-05-19 DIAGNOSIS — R112 Nausea with vomiting, unspecified: Secondary | ICD-10-CM | POA: Insufficient documentation

## 2019-05-19 DIAGNOSIS — R3915 Urgency of urination: Secondary | ICD-10-CM | POA: Insufficient documentation

## 2019-05-19 DIAGNOSIS — F17210 Nicotine dependence, cigarettes, uncomplicated: Secondary | ICD-10-CM | POA: Diagnosis present

## 2019-05-19 DIAGNOSIS — R450 Nervousness: Secondary | ICD-10-CM | POA: Insufficient documentation

## 2019-05-19 DIAGNOSIS — F339 Major depressive disorder, recurrent, unspecified: Secondary | ICD-10-CM | POA: Diagnosis not present

## 2019-05-19 DIAGNOSIS — R1011 Right upper quadrant pain: Secondary | ICD-10-CM | POA: Insufficient documentation

## 2019-05-19 DIAGNOSIS — R109 Unspecified abdominal pain: Secondary | ICD-10-CM | POA: Insufficient documentation

## 2019-05-19 DIAGNOSIS — Z20822 Contact with and (suspected) exposure to covid-19: Secondary | ICD-10-CM | POA: Insufficient documentation

## 2019-05-19 DIAGNOSIS — E46 Unspecified protein-calorie malnutrition: Secondary | ICD-10-CM | POA: Insufficient documentation

## 2019-05-19 DIAGNOSIS — F329 Major depressive disorder, single episode, unspecified: Secondary | ICD-10-CM | POA: Diagnosis present

## 2019-05-19 DIAGNOSIS — F1210 Cannabis abuse, uncomplicated: Secondary | ICD-10-CM | POA: Insufficient documentation

## 2019-05-19 DIAGNOSIS — R630 Anorexia: Secondary | ICD-10-CM | POA: Insufficient documentation

## 2019-05-19 DIAGNOSIS — Z87891 Personal history of nicotine dependence: Secondary | ICD-10-CM | POA: Insufficient documentation

## 2019-05-19 DIAGNOSIS — F322 Major depressive disorder, single episode, severe without psychotic features: Secondary | ICD-10-CM | POA: Diagnosis present

## 2019-05-19 DIAGNOSIS — F3181 Bipolar II disorder: Secondary | ICD-10-CM | POA: Diagnosis not present

## 2019-05-19 DIAGNOSIS — R45851 Suicidal ideations: Secondary | ICD-10-CM | POA: Diagnosis present

## 2019-05-19 DIAGNOSIS — G479 Sleep disorder, unspecified: Secondary | ICD-10-CM | POA: Insufficient documentation

## 2019-05-19 DIAGNOSIS — R1013 Epigastric pain: Secondary | ICD-10-CM | POA: Insufficient documentation

## 2019-05-19 DIAGNOSIS — R4587 Impulsiveness: Secondary | ICD-10-CM | POA: Insufficient documentation

## 2019-05-19 DIAGNOSIS — F419 Anxiety disorder, unspecified: Secondary | ICD-10-CM | POA: Insufficient documentation

## 2019-05-19 DIAGNOSIS — R030 Elevated blood-pressure reading, without diagnosis of hypertension: Secondary | ICD-10-CM | POA: Insufficient documentation

## 2019-05-19 DIAGNOSIS — F332 Major depressive disorder, recurrent severe without psychotic features: Secondary | ICD-10-CM | POA: Diagnosis present

## 2019-05-19 DIAGNOSIS — Z79899 Other long term (current) drug therapy: Secondary | ICD-10-CM | POA: Insufficient documentation

## 2019-05-19 LAB — RESPIRATORY PANEL BY RT PCR (FLU A&B, COVID)
Influenza A by PCR: NEGATIVE
Influenza B by PCR: NEGATIVE
SARS Coronavirus 2 by RT PCR: NEGATIVE

## 2019-05-19 LAB — CBC WITH DIFFERENTIAL/PLATELET
Abs Immature Granulocytes: 0.02 10*3/uL (ref 0.00–0.07)
Basophils Absolute: 0.1 10*3/uL (ref 0.0–0.1)
Basophils Relative: 1 %
Eosinophils Absolute: 0.1 10*3/uL (ref 0.0–0.5)
Eosinophils Relative: 1 %
HCT: 43 % (ref 36.0–46.0)
Hemoglobin: 14.1 g/dL (ref 12.0–15.0)
Immature Granulocytes: 0 %
Lymphocytes Relative: 30 %
Lymphs Abs: 2.6 10*3/uL (ref 0.7–4.0)
MCH: 28.7 pg (ref 26.0–34.0)
MCHC: 32.8 g/dL (ref 30.0–36.0)
MCV: 87.4 fL (ref 80.0–100.0)
Monocytes Absolute: 1 10*3/uL (ref 0.1–1.0)
Monocytes Relative: 11 %
Neutro Abs: 5 10*3/uL (ref 1.7–7.7)
Neutrophils Relative %: 57 %
Platelets: 214 10*3/uL (ref 150–400)
RBC: 4.92 MIL/uL (ref 3.87–5.11)
RDW: 12.8 % (ref 11.5–15.5)
WBC: 8.7 10*3/uL (ref 4.0–10.5)
nRBC: 0 % (ref 0.0–0.2)

## 2019-05-19 LAB — COMPREHENSIVE METABOLIC PANEL
ALT: 27 U/L (ref 0–44)
AST: 36 U/L (ref 15–41)
Albumin: 4.7 g/dL (ref 3.5–5.0)
Alkaline Phosphatase: 54 U/L (ref 38–126)
Anion gap: 11 (ref 5–15)
BUN: 14 mg/dL (ref 6–20)
CO2: 23 mmol/L (ref 22–32)
Calcium: 9.5 mg/dL (ref 8.9–10.3)
Chloride: 102 mmol/L (ref 98–111)
Creatinine, Ser: 0.87 mg/dL (ref 0.44–1.00)
GFR calc Af Amer: >60 mL/min (ref >60)
GFR calc non Af Amer: >60 mL/min (ref >60)
Glucose, Bld: 85 mg/dL (ref 70–99)
Potassium: 4 mmol/L (ref 3.5–5.1)
Sodium: 136 mmol/L (ref 135–145)
Total Bilirubin: 1.2 mg/dL (ref 0.3–1.2)
Total Protein: 8.5 g/dL — ABNORMAL HIGH (ref 6.5–8.1)

## 2019-05-19 LAB — RAPID URINE DRUG SCREEN, HOSP PERFORMED
Amphetamines: NOT DETECTED
Barbiturates: NOT DETECTED
Benzodiazepines: NOT DETECTED
Cocaine: NOT DETECTED
Opiates: NOT DETECTED
Tetrahydrocannabinol: POSITIVE — AB

## 2019-05-19 LAB — ETHANOL: Alcohol, Ethyl (B): <10 mg/dL (ref <10)

## 2019-05-19 LAB — I-STAT BETA HCG BLOOD, ED (MC, WL, AP ONLY): I-stat hCG, quantitative: <5 m[IU]/mL (ref <5)

## 2019-05-19 LAB — SALICYLATE LEVEL: Salicylate Lvl: <7 mg/dL — ABNORMAL LOW (ref 7.0–30.0)

## 2019-05-19 LAB — ACETAMINOPHEN LEVEL: Acetaminophen (Tylenol), Serum: <10 ug/mL — ABNORMAL LOW (ref 10–30)

## 2019-05-19 LAB — LIPASE, BLOOD: Lipase: 28 U/L (ref 11–51)

## 2019-05-19 MED ORDER — ONDANSETRON 4 MG PO TBDP
4.0000 mg | ORAL_TABLET | Freq: Once | ORAL | Status: AC
  Administered 2019-05-19: 17:00:00 4 mg via ORAL
  Filled 2019-05-19: qty 1

## 2019-05-19 MED ORDER — TRAZODONE HCL 50 MG PO TABS
50.0000 mg | ORAL_TABLET | Freq: Every evening | ORAL | Status: DC | PRN
  Administered 2019-05-19: 50 mg via ORAL
  Filled 2019-05-19: qty 1

## 2019-05-19 MED ORDER — ALUM & MAG HYDROXIDE-SIMETH 200-200-20 MG/5ML PO SUSP: 30.0000 mL | ORAL | Status: DC | PRN

## 2019-05-19 MED ORDER — HYDROXYZINE HCL 25 MG PO TABS: 25.0000 mg | ORAL_TABLET | Freq: Three times a day (TID) | ORAL | Status: DC | PRN

## 2019-05-19 MED ORDER — FAMOTIDINE 20 MG PO TABS: 20.0000 mg | ORAL_TABLET | Freq: Two times a day (BID) | ORAL | 0 refills | Status: DC

## 2019-05-19 MED ORDER — ONDANSETRON 8 MG PO TBDP: 8.0000 mg | ORAL_TABLET | Freq: Three times a day (TID) | ORAL | 0 refills | Status: DC | PRN

## 2019-05-19 MED ORDER — ONDANSETRON 8 MG PO TBDP
8.0000 mg | ORAL_TABLET | Freq: Once | ORAL | Status: AC
  Administered 2019-05-19: 8 mg via ORAL
  Filled 2019-05-19: qty 1

## 2019-05-19 MED ORDER — ENSURE ENLIVE PO LIQD
237.0000 mL | Freq: Two times a day (BID) | ORAL | Status: DC
  Administered 2019-05-20 – 2019-05-23 (×8): 237 mL via ORAL

## 2019-05-19 MED ORDER — SUCRALFATE 1 GM/10ML PO SUSP: 1.0000 g | Freq: Three times a day (TID) | ORAL | 0 refills | Status: DC

## 2019-05-19 MED ORDER — ACETAMINOPHEN 325 MG PO TABS
650.0000 mg | ORAL_TABLET | Freq: Four times a day (QID) | ORAL | Status: DC | PRN
  Administered 2019-05-21: 650 mg via ORAL
  Filled 2019-05-19: qty 2

## 2019-05-19 NOTE — ED Notes (Signed)
US at bedside

## 2019-05-19 NOTE — ED Notes (Signed)
Patient not tolerating PO. PA made aware.

## 2019-05-19 NOTE — ED Notes (Signed)
Patient given meal tray.

## 2019-05-19 NOTE — Discharge Instructions (Signed)
Follow up with GI if your symptoms are not improved with the medications provided in the ED>
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
## 2019-05-19 NOTE — ED Notes (Signed)
Transport reports estimated arrival in approximately 40 minutes.

## 2019-05-19 NOTE — ED Notes (Signed)
Sitter at bedside.

## 2019-05-19 NOTE — Progress Notes (Signed)
Pending medical clearance (elevated blood pressure addressed) and negative COVID results - pt to be admitted to [facility] Room 300-02, service of MD [place].  Report can be called to [phone]

## 2019-05-19 NOTE — ED Notes (Signed)
Safe Transport called to take patient to [facility].  

## 2019-05-19 NOTE — ED Notes (Signed)
Patient changed into paper scrubs and wanded by security. 

## 2019-05-19 NOTE — Progress Notes (Signed)
Admission Note:  Pt is a 24 year old AAF admitted to the services of Dr. Parke Poisson for depression and suicidal ideation.  Pt states that she has been suicidal for the last nine months but becoming homeless two weeks ago is her biggest stressor now.  Pt is unemployed.  She denies drug abuse other than THC, and occasional alcohol.  Pt reports weight loss of 40 lbs in the last three months.  PT is pleasant, anxious, and cooperative with the admission process.  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

## 2019-05-19 NOTE — ED Triage Notes (Signed)
Patient brought from [facility] for medical clearance. C/o SI. Reportedly has bed across the street once cleared.

## 2019-05-19 NOTE — ED Provider Notes (Signed)
[facility] DEPT Provider Note   CSN: [id number] Arrival date & time: 05/19/19  1559    History Chief Complaint  Patient presents with  . Suicidal  . Medical Clearance    Sydney Higgins is a 24 y.o. female with past medical history significant for anxiety, DMDD who presents for evaluation of medical clearance.  Patient has bed at [facility] for SI.  Patient has had depression progression over the last 9 months.  Admits to homelessness.  Adamantly holes with boyfriend versus homeless.  Denies hallucinations.  No plan.  Denies AVH.  Has had some intermittent emesis over the last 2 months.  Has had some right side abdominal pain for greater than 1 month.  Pain is not worse with food intake.  Denies fever, chills, chest pain, shortness of breath, pelvic pain, vaginal discharge, dysuria, diarrhea or constipation.  Denies additional aggravating or alleviating factors.    History obtained from patient and past medical records.  No interpreter is used  HPI     Past Medical History:  Diagnosis Date  . Anxiety   . 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 DMDD (disruptive mood dysregulation disorder) ([place]) 12/11/2015  . Headache   . Insomnia 12/11/2015  . 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 Encounter for gynecological examination with Papanicolaou smear of cervix 02/14/2019  . Patient desires pregnancy 01/31/2019  . Urine pregnancy test negative 01/31/2019  . Contraceptive education 09/05/2017  . Irregular bleeding 09/05/2017  . 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 Mass of left side of neck   . Depression 01/11/2017  . Breast pain 01/11/2017  . Weight loss 01/11/2017  . Abnormal uterine bleeding (AUB) 01/11/2017  . Screening examination for STD (sexually transmitted disease) 01/11/2017  . Constipation 12/17/2015  .  MDD (major depressive disorder), recurrent episode, severe ([place]) 12/11/2015  . 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 DMDD (disruptive mood dysregulation disorder) ([place]) 12/11/2015  . Insomnia 12/11/2015    Past Surgical History:  Procedure Laterality Date  . MASS EXCISION Left 08/26/2017   Procedure: EXCISION SUPERFICIAL MASS FROM LEFT SIDE OF NECK;  Surgeon: Virl Cagey, MD;  Location: [facility];  Service: General;  Laterality: Left;     OB History    Gravida  2   Para      Term      Preterm      AB  2   Living  0     SAB  2   TAB      Ectopic      Multiple      Live Births              Family History  Problem Relation Age of Onset  . Cancer Paternal Grandfather   . Cancer Paternal Grandmother   . Diabetes Maternal Grandmother   . Diabetes Maternal Grandfather   . Hypertension Father   . Bipolar disorder Brother   . Deafness Sister     Social History   Tobacco Use  . Smoking status: Former Smoker    Types: E-cigarettes  . Smokeless tobacco: Never Used  Substance Use Topics  . Alcohol use: Not Currently  . Drug use: Yes    Types: Marijuana    Comment: occ    Home Medications Prior to Admission  medications   Medication Sig Start Date End Date Taking? Authorizing Provider  amphetamine-dextroamphetamine (ADDERALL) 20 MG tablet Take 20 mg by mouth daily.    [provider]  escitalopram (LEXAPRO) 10 MG tablet Take 1 tablet (10 mg total) by mouth daily. 05/17/19 05/16/20  Rolland Porter, MD  famotidine (PEPCID) 20 MG tablet Take 1 tablet (20 mg total) by mouth 2 (two) times daily. 05/19/19   Ellery Tash A, PA-C  ondansetron (ZOFRAN ODT) 8 MG disintegrating tablet Take 1 tablet (8 mg total) by mouth every 8 (eight) hours as needed for nausea or vomiting. 05/19/19   Eulah Walkup A, PA-C  sucralfate (CARAFATE) 1 GM/10ML suspension Take 10 mLs (1 g total) by mouth 4 (four) times daily -  with meals and at bedtime. 05/19/19   Johnae Friley,  Gagandeep Kossman A, PA-C    Allergies    Apple, Banana, Bee venom, and Pear  Review of Systems   Review of Systems  Constitutional: Negative.   HENT: Negative.   Respiratory: Negative.   Cardiovascular: Negative.   Gastrointestinal: Positive for abdominal pain, nausea and vomiting. Negative for abdominal distention, anal bleeding, blood in stool, constipation, diarrhea and rectal pain.  Genitourinary: Negative.   Musculoskeletal: Negative.   Skin: Negative.   Neurological: Negative.   Psychiatric/Behavioral: Positive for sleep disturbance. Negative for hallucinations. The patient is not hyperactive.   All other systems reviewed and are negative.   Physical Exam Updated Vital Signs BP (!) 139/105   Pulse 76   Temp (!) 97.3 F (36.3 C)   Resp 17   LMP 05/19/2019   SpO2 100%   Physical Exam Vitals and nursing note reviewed.  Constitutional:      General: She is not in acute distress.    Appearance: She is well-developed. She is not ill-appearing or toxic-appearing.  HENT:     Head: Normocephalic and atraumatic.     Nose: Nose normal.     Mouth/Throat:     Mouth: Mucous membranes are moist.     Pharynx: Oropharynx is clear.  Eyes:     Pupils: Pupils are equal, round, and reactive to light.  Cardiovascular:     Rate and Rhythm: Normal rate.     Pulses: Normal pulses.     Heart sounds: Normal heart sounds.  Pulmonary:     Effort: Pulmonary effort is normal. No respiratory distress.     Breath sounds: Normal breath sounds.  Abdominal:     General: Bowel sounds are normal. There is no distension.     Tenderness: There is abdominal tenderness in the right upper quadrant, epigastric area and left upper quadrant. There is no right CVA tenderness, left CVA tenderness, guarding or rebound.     Comments: Generalized tenderness palpation to epigastric and right upper quadrant however negative Murphy sign.  Negative CVA tap bilaterally.  No overlying skin changes to abdominal wall   Musculoskeletal:        General: Normal range of motion.     Cervical back: Normal range of motion.  Skin:    General: Skin is warm and dry.     Comments: Brisk cap refill  Neurological:     Mental Status: She is alert.     ED Results / Procedures / Treatments   Labs (all labs ordered are listed, but only abnormal results are displayed) Labs Reviewed  COMPREHENSIVE METABOLIC PANEL - Abnormal; Notable for the following components:      Result Value   Total Protein 8.5 (*)  All other components within normal limits  RAPID URINE DRUG SCREEN, HOSP PERFORMED - Abnormal; Notable for the following components:   Tetrahydrocannabinol POSITIVE (*)    All other components within normal limits  SALICYLATE LEVEL - Abnormal; Notable for the following components:   Salicylate Lvl [id number] (*)    All other components within normal limits  ACETAMINOPHEN LEVEL - Abnormal; Notable for the following components:   Acetaminophen (Tylenol), Serum <10 (*)    All other components within normal limits  RESPIRATORY PANEL BY RT PCR (FLU A&B, COVID)  ETHANOL  CBC WITH DIFFERENTIAL/PLATELET  LIPASE, BLOOD  I-STAT BETA HCG BLOOD, ED ([facility], [facility], [facility] ONLY)    EKG None  Radiology US Abdomen Limited RUQ  Result Date: 05/19/2019 CLINICAL DATA:  Right upper quadrant pain. EXAM: ULTRASOUND ABDOMEN LIMITED RIGHT UPPER QUADRANT COMPARISON:  Abdominal ultrasound dated 08/15/2017. FINDINGS: Gallbladder: No gallstones or wall thickening visualized. No sonographic Murphy sign noted by sonographer. Common bile duct: Diameter: 2 mm Liver: No focal lesion identified. Within normal limits for parenchymal echogenicity. Portal vein is patent on color Doppler imaging with normal direction of blood flow towards the liver. Other: None. IMPRESSION: No findings to explain the patient's symptoms. Electronically Signed   By: Zerita Boers M.D.   On: 05/19/2019 17:33    Procedures Procedures (including critical care time)   Medications Ordered in ED Medications  ondansetron (ZOFRAN-ODT) disintegrating tablet 4 mg (4 mg Oral Given 05/19/19 1655)  ondansetron (ZOFRAN-ODT) disintegrating tablet 8 mg (8 mg Oral Given 05/19/19 1834)    ED Course  I have reviewed the triage vital signs and the nursing notes.  Pertinent labs & imaging results that were available during my care of the patient were reviewed by me and considered in my medical decision making (see chart for details).  23 year old for evaluation medical clearance as well as abdominal pain.  Has been at [facility] for SI.  Patient sent over here to evaluate for abdominal pain.  Chronic abdominal pain x1 month with intermittent emesis times months.  No prior right upper quadrant ultrasound.  She has some tenderness to epigastric and right upper quadrant area.  No alcohol or NSAID use.  Denies chance of pregnancy.  Peers overall well.  Heart and lungs clear.  Plan on screening labs, ultrasound, zofran.  Labs and imaging personally reviewed and interpreted:   Clinical Course as of May 18 2021  Sat May 19, 2019  1740 [place] negative for any acute abnormality  US Abdomen Limited RUQ [BH]  1740 Positive for [facility]  Urine rapid drug screen (hosp performed)(!) [BH]  1740 No electrolyte, renal or liver abnormality  Comprehensive metabolic panel(!) [BH]  [id number] Pregnancy test negative  I-Stat beta hCG blood, ED [BH]  1741 28  Lipase, blood [BH]    Clinical Course User Index [BH] Charls Custer A, PA-C   1800: Patient reassessed. Improvement in sx with Zofran. [place] negative for stones. Question PUD. Will start on Pepcid and Zofran as needed for emesis. Encourage follow up with GI. Question some degree of anxiety contributing as patient states emesis worse with anxiety. Resp panel negative here.   1900: Initial emesis, however patient given additional zofran and was able to tolerate meal without emesis or nausea. Repeat BP 130/85 in room. She will need to follow up with PCP  for this however low suspicion for HTN urgency or emergency at this time.  Patient medically cleared. She will be transported [facility].   MDM  Rules/Calculators/A&P                       Final Clinical Impression(s) / ED Diagnoses Final diagnoses:  Epigastric abdominal pain  Suicidal ideation    Rx / DC Orders ED Discharge Orders         Ordered    ondansetron (ZOFRAN ODT) 8 MG disintegrating tablet  Every 8 hours PRN     05/19/19 1908    famotidine (PEPCID) 20 MG tablet  2 times daily     05/19/19 1908    sucralfate (CARAFATE) 1 GM/10ML suspension  3 times daily with meals & bedtime     05/19/19 1908           Mivaan Corbitt A, PA-C 05/19/19 2023    Davonna Belling, MD 05/19/19 2027

## 2019-05-19 NOTE — H&P (Signed)
Behavioral Health Medical Screening Exam  Sydney Higgins is an 24 y.o. female.  Total Time spent with patient: 30 minutes  Psychiatric Specialty Exam: Physical Exam  Constitutional: She is oriented to person, place, and time.  Thin frame  HENT:  Head: Normocephalic.  Eyes: Pupils are equal, round, and reactive to light.  Cardiovascular:  Elevated blood pressure 147/101; second attempt 141/109; patient is asymptomatic.   Respiratory: Effort normal.  Musculoskeletal:        General: Normal range of motion.     Cervical back: Normal range of motion.  Neurological: She is alert and oriented to person, place, and time.    Review of Systems  Constitutional: Positive for appetite change. Negative for chills, diaphoresis and fatigue.  Gastrointestinal: Positive for abdominal pain and constipation.  Psychiatric/Behavioral: Positive for dysphoric mood, self-injury (remote history of cutting; last occurence 1 month prior), sleep disturbance and suicidal ideas. Negative for hallucinations. The patient is nervous/anxious.     Blood pressure (!) 141/109, pulse 92, temperature (!) 97 F (36.1 C), temperature source Tympanic, resp. rate 20, SpO2 100 %.There is no height or weight on file to calculate BMI.  General Appearance: Disheveled and thin framed, malnourished appearance  Eye Contact:  Good  Speech:  Clear and Coherent and Slow  Volume:  Decreased  Mood:  Anxious, Depressed, Dysphoric and Worthless  Affect:  Congruent and Labile  Thought Process:  Coherent and Descriptions of Associations: Circumstantial  Orientation:  Full (Time, Place, and Person)  Thought Content:  Illogical and Rumination  Suicidal Thoughts:  Yes.  without intent/plan  Homicidal Thoughts:  No  Memory:  Immediate;   Fair Recent;   Fair Remote;   Fair  Judgement:  Other:  as evidenced by thoughts to harm herself  Insight:  Fair  Psychomotor Activity:  Decreased  Concentration: Concentration: Good and Attention Span:  Good  Recall:  Good  Fund of Knowledge:Good  Language: Good  Akathisia:  Negative  Handed:  Right  AIMS (if indicated):     Assets:  Communication Skills Desire for Improvement  Sleep:   <2 hours     Musculoskeletal: Strength & Muscle Tone: within normal limits Gait & Station: normal Patient leans: N/A  Blood pressure (!) 141/109, pulse 92, temperature (!) 97 F (36.1 C), temperature source Tympanic, resp. rate 20, SpO2 100 %.  Recommendations:  Chart reviewed and patient seen for face to face evaluation.  She reports a pmhx for bipolar 1, depression, anxiety and progressing suicidal ideations over the past few weeks. She does not name a specific plan but is impulsive.  She reports one inpatient hospitalization for suicidal attempt by hanging in 2017. She presents sad, labile mood, low energy and endorses sleep deprivation, decreased appetite.  She is not taking psychiatric medications, endorses unintentional 40 pound weight loss over the past 3 months.  She would benefit from psychiatric admission but in the setting of elevated blood pressure; RUQ abdominal pain, and urinary urgency, she will need medical clearance first.  Based on my evaluation the patient appears to have an emergency medical condition for which I recommend the patient be transferred to the emergency department for further evaluation.  When cleared, recommend inpatient admission to start medication for mood stabilization.   Mallie Darting, NP 05/19/2019, 3:45 PM

## 2019-05-19 NOTE — ED Notes (Signed)
Attempted report to [facility] x2.

## 2019-05-20 DIAGNOSIS — F3181 Bipolar II disorder: Secondary | ICD-10-CM

## 2019-05-20 MED ORDER — LORAZEPAM 0.5 MG PO TABS: 0.5000 mg | ORAL_TABLET | Freq: Four times a day (QID) | ORAL | Status: DC | PRN

## 2019-05-20 MED ORDER — ONDANSETRON 4 MG PO TBDP
ORAL_TABLET | ORAL | Status: AC
  Administered 2019-05-20: 8 mg via ORAL
  Filled 2019-05-20: qty 2

## 2019-05-20 MED ORDER — ONDANSETRON 8 MG PO TBDP
8.0000 mg | ORAL_TABLET | Freq: Once | ORAL | Status: AC
  Filled 2019-05-20: qty 1
  Filled 2019-05-20: qty 2

## 2019-05-20 MED ORDER — PANTOPRAZOLE SODIUM 40 MG PO TBEC
40.0000 mg | DELAYED_RELEASE_TABLET | Freq: Every day | ORAL | Status: DC
  Administered 2019-05-20 – 2019-05-24 (×5): 40 mg via ORAL
  Filled 2019-05-20 (×8): qty 1

## 2019-05-20 MED ORDER — NICOTINE 21 MG/24HR TD PT24
21.0000 mg | MEDICATED_PATCH | Freq: Every day | TRANSDERMAL | Status: DC
  Administered 2019-05-20 – 2019-05-24 (×5): 21 mg via TRANSDERMAL
  Filled 2019-05-20 (×7): qty 1

## 2019-05-20 MED ORDER — ONDANSETRON 4 MG PO TBDP
4.0000 mg | ORAL_TABLET | Freq: Once | ORAL | Status: AC
  Administered 2019-05-20: 4 mg via ORAL
  Filled 2019-05-20 (×2): qty 1

## 2019-05-20 MED ORDER — QUETIAPINE FUMARATE 50 MG PO TABS
50.0000 mg | ORAL_TABLET | Freq: Every day | ORAL | Status: DC
  Administered 2019-05-20: 50 mg via ORAL
  Filled 2019-05-20 (×2): qty 1

## 2019-05-20 NOTE — Progress Notes (Signed)
   05/20/19 2049  COVID-19 Daily Checkoff  Have you had a fever (temp > 37.80C/100F)  in the past 24 hours?  No  If you have had runny nose, nasal congestion, sneezing in the past 24 hours, has it worsened? No  COVID-19 EXPOSURE  Have you traveled outside the state in the past 14 days? No  Have you been in contact with someone with a confirmed diagnosis of COVID-19 or PUI in the past 14 days without wearing appropriate PPE? No  Have you been living in the same home as a person with confirmed diagnosis of COVID-19 or a PUI (household contact)? No  Have you been diagnosed with COVID-19? No

## 2019-05-20 NOTE — [facility] Group Notes (Signed)
Adult Psychoeducational Group Note  Date:  05/20/2019 Time:  9:00AM-10:00AM  Group Topic/Focus:  Progressive Relaxation.  A group that uses progressive relaxation with the different parts of the body tensing and relaxing. Also learning deep breathing and visualization  Participation Level:  Active  Participation Quality:  Appropriate  Affect:  Blunted  Cognitive:  Appropriate  Insight: Appropriate  Engagement in Group:  Engaged  Modes of Intervention:  Activity and Education  Additional Comments:  Pt participated fully in the grouop  Sydney Higgins A  05/20/2019   2:50 PM

## 2019-05-20 NOTE — Progress Notes (Addendum)
   05/20/19 1200  Psych Admission Type (Psych Patients Only)  Admission Status Voluntary  Psychosocial Assessment  Patient Complaints Anxiety;Depression  Eye Contact Fair  Facial Expression Anxious  Affect Appropriate to circumstance  Speech Logical/coherent  Interaction Assertive  Motor Activity Other (Comment) (WDL)  Appearance/Hygiene Unremarkable  Behavior Characteristics Cooperative;Appropriate to situation  Mood Anxious;Pleasant  Thought Process  Coherency WDL  Content WDL  Delusions None reported or observed  Perception WDL  Hallucination None reported or observed  Judgment Impaired  Confusion None  Danger to Self  Current suicidal ideation? Denies  Danger to Others  Danger to Others None reported or observed    Pt has been visible on the unit interacting well with peers and attending groups. Per pt's self inventory this am, pt rated her depression, hopelessness and anxiety a 9/9/8, respectively. Pt currently denies SI/HI and A/VH

## 2019-05-20 NOTE — [facility] Group Notes (Signed)
LCSW Group Therapy Note  05/20/2019    10:00-11:00am   Type of Therapy and Topic:  Group Therapy: Shame and Its Impact   Participation Level:  Active   Description of Group:   In this group, patients shared and discussed that guilt is the negative feeling we have when we've done something wrong, while shame is the negative feeling we have simply about "being."  In listening to each other share, patients learned that humans are all imperfect and that there is no shame in this.  We discussed how it could positively impact our wellbeing by accepting our faults as part of our being that can be worked on but does not have to shame [place].    Therapeutic Goals: Patients will learn the difference between guilt and shame. Patients will share their current shame feelings and how this has impacted their current lives. Patients will explore possible ways to think differently about those parts of their bodies, feelings, and lives about which they do have shame. Patients will learn that shame is universal, and that keeping our shame a secret actually increases its hold on [place].  Summary of Patient Progress:  The patient shared that she feels shame about being an aggressor.  This is of concern because people in her life, even if they love, don't want to be around her.  She talked a lot during group with insight about the need to change in order to have the life she really wants to haves.  Therapeutic Modalities:   Cognitive Behavioral Therapy Motivation Interviewing  Maretta Los  .
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
## 2019-05-20 NOTE — Progress Notes (Signed)
[place] NOVEL CORONAVIRUS (COVID-19) DAILY CHECK-OFF SYMPTOMS - answer yes or no to each - every day NO YES  Have you had a fever in the past 24 hours?  . Fever (Temp > 37.80C / 100F) X   Have you had any of these symptoms in the past 24 hours? . New Cough .  Sore Throat  .  Shortness of Breath .  Difficulty Breathing .  Unexplained Body Aches   X   Have you had any one of these symptoms in the past 24 hours not related to allergies?   . Runny Nose .  Nasal Congestion .  Sneezing   X   If you have had runny nose, nasal congestion, sneezing in the past 24 hours, has it worsened?  X   EXPOSURES - check yes or no X   Have you traveled outside the state in the past 14 days?  X   Have you been in contact with someone with a confirmed diagnosis of COVID-19 or PUI in the past 14 days without wearing appropriate PPE?  X   Have you been living in the same home as a person with confirmed diagnosis of COVID-19 or a PUI (household contact)?    X   Have you been diagnosed with COVID-19?    X              What to do next: Answered NO to all: Answered YES to anything:   Proceed with unit schedule Follow the [facility] Inpatient Flowsheet.   

## 2019-05-20 NOTE — [facility] Group Notes (Signed)
Psychoeducational Group Note  Date:  05-20-19 Time:  1300  Group Topic/Focus:  Making Healthy Choices:   The focus of this group is to help patients identify negative/unhealthy choices they were using prior to admission and identify positive/healthier coping strategies to replace them upon discharge.  Participation Level:  Active  Participation Quality:  Appropriate  Affect:  Appropriate  Cognitive:  Oriented  Insight:  Improving  Engagement in Group:  Engaged  Additional Comments:  Pt was able to identify unhealthy choices and to make healthier   Bryson Dames A 1520

## 2019-05-20 NOTE — Progress Notes (Signed)
   05/20/19 2053  Psych Admission Type (Psych Patients Only)  Admission Status Voluntary  Psychosocial Assessment  Patient Complaints Insomnia  Eye Contact Fair  Facial Expression Anxious  Affect Appropriate to circumstance  Speech Logical/coherent  Interaction Assertive  Motor Activity Other (Comment) (WDL)  Appearance/Hygiene Unremarkable  Behavior Characteristics Appropriate to situation  Mood Anxious;Pleasant  Thought Process  Coherency WDL  Content WDL  Delusions None reported or observed  Perception WDL  Hallucination None reported or observed  Judgment Impaired  Confusion None  Danger to Self  Current suicidal ideation? Denies  Danger to Others  Danger to Others None reported or observed

## 2019-05-21 LAB — LIPID PANEL
Cholesterol: 121 mg/dL (ref 0–200)
HDL: 37 mg/dL — ABNORMAL LOW (ref >40)
LDL Cholesterol: 77 mg/dL (ref 0–99)
Total CHOL/HDL Ratio: 3.3 RATIO
Triglycerides: 34 mg/dL (ref <150)
VLDL: 7 mg/dL (ref 0–40)

## 2019-05-21 LAB — HEMOGLOBIN A1C
Hgb A1c MFr Bld: 5.3 % (ref 4.8–5.6)
Mean Plasma Glucose: 105.41 mg/dL

## 2019-05-21 LAB — TSH: TSH: 2.9 u[IU]/mL (ref 0.350–4.500)

## 2019-05-21 MED ORDER — QUETIAPINE FUMARATE 100 MG PO TABS
100.0000 mg | ORAL_TABLET | Freq: Every day | ORAL | Status: DC
  Administered 2019-05-21: 100 mg via ORAL
  Filled 2019-05-21 (×4): qty 1

## 2019-05-21 MED ORDER — QUETIAPINE FUMARATE 100 MG PO TABS: 100.0000 mg | ORAL_TABLET | Freq: Every day | ORAL | Status: DC

## 2019-05-21 NOTE — Progress Notes (Signed)
   05/21/19 2100  Psych Admission Type (Psych Patients Only)  Admission Status Voluntary  Psychosocial Assessment  Patient Complaints Anxiety  Eye Contact Fair  Facial Expression Anxious  Affect Appropriate to circumstance  Speech Logical/coherent  Interaction Assertive  Motor Activity Other (Comment) (WDL)  Appearance/Hygiene Unremarkable  Behavior Characteristics Cooperative  Mood Anxious;Pleasant  Thought Process  Coherency WDL  Content WDL  Delusions None reported or observed  Perception WDL  Hallucination None reported or observed  Judgment Impaired  Confusion None  Danger to Self  Current suicidal ideation? Denies  Danger to Others  Danger to Others None reported or observed

## 2019-05-21 NOTE — Progress Notes (Signed)
   05/21/19 0830  Psych Admission Type (Psych Patients Only)  Admission Status Voluntary  Psychosocial Assessment  Patient Complaints Anxiety  Eye Contact Fair  Facial Expression Anxious  Affect Appropriate to circumstance  Speech Logical/coherent  Interaction Assertive  Motor Activity Other (Comment) (WDL)  Appearance/Hygiene Unremarkable  Behavior Characteristics Cooperative  Mood Pleasant;Anxious  Thought Process  Coherency WDL  Content WDL  Delusions None reported or observed  Perception WDL  Hallucination None reported or observed  Judgment Impaired  Confusion None  Danger to Self  Current suicidal ideation? Denies  Danger to Others  Danger to Others None reported or observed

## 2019-05-21 NOTE — Tx Team (Signed)
Interdisciplinary Treatment and Diagnostic Plan Update  05/21/2019 Time of Session: 9:05am Sydney Higgins MRN: [id number]  Principal Diagnosis: <principal problem not specified>  Secondary Diagnoses: Active Problems:   MDD (major depressive disorder)   Current Medications:  Current Facility-Administered Medications  Medication Dose Route Frequency Provider Last Rate Last Admin  . acetaminophen (TYLENOL) tablet 650 mg  650 mg Oral Q6H PRN Merlyn Lot E, NP      . alum & mag hydroxide-simeth (MAALOX/MYLANTA) 200-200-20 MG/5ML suspension 30 mL  30 mL Oral Q4H PRN Merlyn Lot E, NP      . feeding supplement (ENSURE ENLIVE) (ENSURE ENLIVE) liquid 237 mL  237 mL Oral BID BM Cobos, Myer Peer, MD   237 mL at 05/21/19 0904  . LORazepam (ATIVAN) tablet 0.5 mg  0.5 mg Oral Q6H PRN Cobos, Fernando A, MD      . nicotine (NICODERM CQ - dosed in mg/24 hours) patch 21 mg  21 mg Transdermal Daily Cobos, Myer Peer, MD   21 mg at 05/21/19 0834  . pantoprazole (PROTONIX) EC tablet 40 mg  40 mg Oral Daily Cobos, Myer Peer, MD   40 mg at 05/21/19 0834  . QUEtiapine (SEROQUEL) tablet 50 mg  50 mg Oral QHS Cobos, Myer Peer, MD   50 mg at 05/20/19 2158   PTA Medications: Medications Prior to Admission  Medication Sig Dispense Refill Last Dose  . amphetamine-dextroamphetamine (ADDERALL) 20 MG tablet Take 20 mg by mouth daily.     Marland Kitchen escitalopram (LEXAPRO) 10 MG tablet Take 1 tablet (10 mg total) by mouth daily. 30 tablet 0   . famotidine (PEPCID) 20 MG tablet Take 1 tablet (20 mg total) by mouth 2 (two) times daily. 30 tablet 0   . ondansetron (ZOFRAN ODT) 8 MG disintegrating tablet Take 1 tablet (8 mg total) by mouth every 8 (eight) hours as needed for nausea or vomiting. 20 tablet 0   . sucralfate (CARAFATE) 1 GM/10ML suspension Take 10 mLs (1 g total) by mouth 4 (four) times daily -  with meals and at bedtime. 420 mL 0     Patient Stressors:    Patient Strengths:    Treatment Modalities: Medication  Management, Group therapy, Case management,  1 to 1 session with clinician, Psychoeducation, Recreational therapy.   Physician Treatment Plan for Primary Diagnosis: <principal problem not specified> Long Term Goal(s): Improvement in symptoms so as ready for discharge Improvement in symptoms so as ready for discharge   Short Term Goals: Ability to identify changes in lifestyle to reduce recurrence of condition will improve Ability to verbalize feelings will improve Ability to disclose and discuss suicidal ideas Ability to demonstrate self-control will improve Ability to identify and develop effective coping behaviors will improve Ability to maintain clinical measurements within normal limits will improve Ability to identify changes in lifestyle to reduce recurrence of condition will improve Ability to verbalize feelings will improve Ability to disclose and discuss suicidal ideas Ability to demonstrate self-control will improve Ability to identify and develop effective coping behaviors will improve Ability to maintain clinical measurements within normal limits will improve Compliance with prescribed medications will improve  Medication Management: Evaluate patient's response, side effects, and tolerance of medication regimen.  Therapeutic Interventions: 1 to 1 sessions, Unit Group sessions and Medication administration.  Evaluation of Outcomes: Not Met  Physician Treatment Plan for Secondary Diagnosis: Active Problems:   MDD (major depressive disorder)  Long Term Goal(s): Improvement in symptoms so as ready for discharge Improvement in symptoms so  as ready for discharge   Short Term Goals: Ability to identify changes in lifestyle to reduce recurrence of condition will improve Ability to verbalize feelings will improve Ability to disclose and discuss suicidal ideas Ability to demonstrate self-control will improve Ability to identify and develop effective coping behaviors will  improve Ability to maintain clinical measurements within normal limits will improve Ability to identify changes in lifestyle to reduce recurrence of condition will improve Ability to verbalize feelings will improve Ability to disclose and discuss suicidal ideas Ability to demonstrate self-control will improve Ability to identify and develop effective coping behaviors will improve Ability to maintain clinical measurements within normal limits will improve Compliance with prescribed medications will improve     Medication Management: Evaluate patient's response, side effects, and tolerance of medication regimen.  Therapeutic Interventions: 1 to 1 sessions, Unit Group sessions and Medication administration.  Evaluation of Outcomes: Not Met   RN Treatment Plan for Primary Diagnosis: <principal problem not specified> Long Term Goal(s): Knowledge of disease and therapeutic regimen to maintain health will improve  Short Term Goals: Ability to participate in decision making will improve, Ability to disclose and discuss suicidal ideas, Ability to identify and develop effective coping behaviors will improve and Compliance with prescribed medications will improve  Medication Management: RN will administer medications as ordered by provider, will assess and evaluate patient's response and provide education to patient for prescribed medication. RN will report any adverse and/or side effects to prescribing provider.  Therapeutic Interventions: 1 on 1 counseling sessions, Psychoeducation, Medication administration, Evaluate responses to treatment, Monitor vital signs and CBGs as ordered, Perform/monitor CIWA, COWS, AIMS and Fall Risk screenings as ordered, Perform wound care treatments as ordered.  Evaluation of Outcomes: Not Met   LCSW Treatment Plan for Primary Diagnosis: <principal problem not specified> Long Term Goal(s): Safe transition to appropriate next level of care at discharge, Engage  patient in therapeutic group addressing interpersonal concerns.  Short Term Goals: Engage patient in aftercare planning with referrals and resources  Therapeutic Interventions: Assess for all discharge needs, 1 to 1 time with Social worker, Explore available resources and support systems, Assess for adequacy in community support network, Educate family and significant other(s) on suicide prevention, Complete Psychosocial Assessment, Interpersonal group therapy.  Evaluation of Outcomes: Not Met   Progress in Treatment: Attending groups: Yes. Participating in groups: Yes. Taking medication as prescribed: Yes. Toleration medication: Yes. Family/Significant other contact made: No, will contact:  the patient's fiance' Patient understands diagnosis: Yes. Discussing patient identified problems/goals with staff: Yes. Medical problems stabilized or resolved: Yes. Denies suicidal/homicidal ideation: Yes. Issues/concerns per patient self-inventory: No. Other:   New problem(s) identified: None   New Short Term/Long Term Goal(s): medication stabilization, elimination of SI thoughts, development of comprehensive mental wellness plan.    Patient Goals:  "Not to get so aggressive and angry and to not panic"   Discharge Plan or Barriers: Patient is currently homeless. Patient admitte over the weekend. CSW will continue to follow and assess for appropriate referrals and possible discharge planning.    Reason for Continuation of Hospitalization: Anxiety Depression Medication stabilization Suicidal ideation  Estimated Length of Stay: 3-5 days   Attendees: Patient: Sydney Higgins  05/21/2019 9:11 AM  Physician: Dr. Neita Garnet, MD 05/21/2019 9:11 AM  Nursing:  05/21/2019 9:11 AM  RN Care Manager: 05/21/2019 9:11 AM  Social Worker: Cathleen Corti 05/21/2019 9:11 AM  Recreational Therapist:  05/21/2019 9:11 AM  Other: Harriett Sine, NP 05/21/2019 9:11 AM  Other:  05/21/2019 9:11 AM  Other:  05/21/2019 9:11 AM    Scribe for Treatment Team: Marylee Floras, [place] 05/21/2019 9:11 AM

## 2019-05-21 NOTE — Progress Notes (Addendum)
[facility] MD Progress Note  05/21/2019 11:23 AM Sydney Higgins  MRN:  [id number] Subjective:  "I'm tired and depressed."  Sydney Higgins found lying in bed. She reports persistent depression with difficulty staying awake this morning. She was started on Seroquel 50 mg last night. She wishes to continue the Seroquel despite sedation this morning because her brother did well on Seroquel. She is agreeable to taking medication at an earlier time tonight. She reports good sleep overnight last night but with nightmares, but she did sleep with a nicotine patch on.  She reports that she and her fiance have both been noticing increased mood instability in her recently. She states mood instability started at age 12 but has especially worsened over the past year. She is concerned because multiple family members have problems with substance use disorders and anger issues, and she does not want to follow down the same path. In particular her mother has been a negative influence on her because of anger management problems; her mother unexpectedly screamed at her and hung up the phone yesterday. She wants to work on boundary setting with her mother as well as starting medications so she will not have the same issues with anger.  She denies SI/HI/VH but does report thinking she hears whispering when she leaves a room with other people and thinks that other people are talking about her. She says this has happened several times in the dayroom since she has been hospitalized. Denies thoughts that she is being watched or followed and shows no signs of responding to internal stimuli.  From admission H&P: 24 year old female, presented to hospital voluntarily on 2/20. Reports she has been feeling increasingly depressed and anxious over the last few months. She also endorses recent increased frequency of  panic attacks. She reports recent suicidal ideations , without plan or intention   Principal Problem: <principal problem not  specified> Diagnosis: Active Problems:   MDD (major depressive disorder)  Total Time spent with patient: 20 minutes  Past Psychiatric History: See admission H&P  Past Medical History:  Past Medical History:  Diagnosis Date  . Anxiety   . 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 DMDD (disruptive mood dysregulation disorder) ([place]) 12/11/2015  . Headache   . Insomnia 12/11/2015  . 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 MASS EXCISION Left 08/26/2017   Procedure: EXCISION SUPERFICIAL MASS FROM LEFT SIDE OF NECK;  Surgeon: Virl Cagey, MD;  Location: [facility];  Service: General;  Laterality: Left;   Family History:  Family History  Problem Relation Age of Onset  . Cancer Paternal Grandfather   . Cancer Paternal Grandmother   . Diabetes Maternal Grandmother   . Diabetes Maternal Grandfather   . Hypertension Father   . Bipolar disorder Brother   . Deafness Sister    Family Psychiatric  History: See admission H&P Social History:  Social History   Substance and Sexual Activity  Alcohol Use Not Currently     Social History   Substance and Sexual Activity  Drug Use Yes  . Types: Marijuana   Comment: occ    Social History   Socioeconomic History  . Marital status: Significant Other    Spouse name: Not on file  . Number of children: Not on file  . Years of education: Not on file  . Highest education level: Not on file  Occupational  History  . Not on file  Tobacco Use  . Smoking status: Former Smoker    Types: E-cigarettes  . Smokeless tobacco: Never Used  Substance and Sexual Activity  . Alcohol use: Not Currently  . Drug use: Yes    Types: Marijuana    Comment: occ  . Sexual activity: Yes    Birth control/protection: None  Other Topics Concern  . Not on file  Social History Narrative  . Not on file   Social Determinants of Health   Financial Resource  Strain:   . Difficulty of Paying Living Expenses: Not on file  Food Insecurity:   . Worried About Charity fundraiser in the Last Year: Not on file  . Ran Out of Food in the Last Year: Not on file  Transportation Needs:   . Lack of Transportation (Medical): Not on file  . Lack of Transportation (Non-Medical): Not on file  Physical Activity:   . Days of Exercise per Week: Not on file  . Minutes of Exercise per Session: Not on file  Stress:   . Feeling of Stress : Not on file  Social Connections:   . Frequency of Communication with Friends and Family: Not on file  . Frequency of Social Gatherings with Friends and Family: Not on file  . Attends Religious Services: Not on file  . Active Member of Clubs or Organizations: Not on file  . Attends Archivist Meetings: Not on file  . Marital Status: Not on file   Additional Social History:                         Sleep: Good  Appetite:  Fair  Current Medications: Current Facility-Administered Medications  Medication Dose Route Frequency Provider Last Rate Last Admin  . acetaminophen (TYLENOL) tablet 650 mg  650 mg Oral Q6H PRN Merlyn Lot E, NP   650 mg at 05/21/19 0949  . alum & mag hydroxide-simeth (MAALOX/MYLANTA) 200-200-20 MG/5ML suspension 30 mL  30 mL Oral Q4H PRN Merlyn Lot E, NP      . feeding supplement (ENSURE ENLIVE) (ENSURE ENLIVE) liquid 237 mL  237 mL Oral BID BM Dontavion Noxon, Myer Peer, MD   237 mL at 05/21/19 0904  . LORazepam (ATIVAN) tablet 0.5 mg  0.5 mg Oral Q6H PRN Llesenia Fogal A, MD      . nicotine (NICODERM CQ - dosed in mg/24 hours) patch 21 mg  21 mg Transdermal Daily Amzie Sillas, Myer Peer, MD   21 mg at 05/21/19 0834  . pantoprazole (PROTONIX) EC tablet 40 mg  40 mg Oral Daily Jessi Jessop, Myer Peer, MD   40 mg at 05/21/19 0834  . QUEtiapine (SEROQUEL) tablet 100 mg  100 mg Oral QHS Connye Burkitt, NP        Lab Results:  Results for orders placed or performed during the hospital encounter of  05/19/19 (from the past 48 hour(s))  Hemoglobin A1c     Status: None   Collection Time: 05/21/19  6:16 AM  Result Value Ref Range   Hgb A1c MFr Bld 5.3 4.8 - 5.6 %    Comment: (NOTE) Pre diabetes:          5.7%-6.4% Diabetes:              >6.4% Glycemic control for   <7.0% adults with diabetes    Mean Plasma Glucose 105.41 mg/dL    Comment: Performed at [facility]  [address] [place], [place] [address]  Lipid panel     Status: Abnormal   Collection Time: 05/21/19  6:16 AM  Result Value Ref Range   Cholesterol 121 0 - 200 mg/dL   Triglycerides 34 <150 mg/dL   HDL 37 (L) >40 mg/dL   Total CHOL/HDL Ratio 3.3 RATIO   VLDL 7 0 - 40 mg/dL   LDL Cholesterol 77 0 - 99 mg/dL    Comment:        Total Cholesterol/HDL:CHD Risk Coronary Heart Disease Risk Table                     Men   Women  1/2 Average Risk   3.4   3.3  Average Risk       5.0   4.4  2 X Average Risk   9.6   7.1  3 X Average Risk  23.4   11.0        Use the calculated Patient Ratio above and the CHD Risk Table to determine the patient's CHD Risk.        ATP III CLASSIFICATION (LDL):  <100     mg/dL   Optimal  100-129  mg/dL   Near or Above                    Optimal  130-159  mg/dL   Borderline  160-189  mg/dL   High  >190     mg/dL   Very High Performed at [facility] [address] [place], [place] [address]   TSH     Status: None   Collection Time: 05/21/19  6:16 AM  Result Value Ref Range   TSH 2.900 0.350 - 4.500 uIU/mL    Comment: Performed by a 3rd Generation assay with a functional sensitivity of <=0.01 uIU/mL. Performed at [facility], [place] [address] [place], [place] [address]     Blood Alcohol level:  Lab Results  Component Value Date   ETH <10 05/19/2019   ETH <10 [id number]    Metabolic Disorder Labs: Lab Results  Component Value Date   HGBA1C 5.3 05/21/2019   MPG 105.41 05/21/2019   MPG 103 12/12/2015   No results found for:  PROLACTIN Lab Results  Component Value Date   CHOL 121 05/21/2019   TRIG 34 05/21/2019   HDL 37 (L) 05/21/2019   CHOLHDL 3.3 05/21/2019   VLDL 7 05/21/2019   LDLCALC 77 05/21/2019   LDLCALC 68 12/12/2015    Physical Findings: AIMS: Facial and Oral Movements Muscles of Facial Expression: None, normal Lips and Perioral Area: None, normal Jaw: None, normal Tongue: None, normal,Extremity Movements Upper (arms, wrists, hands, fingers): None, normal Lower (legs, knees, ankles, toes): None, normal, Trunk Movements Neck, shoulders, hips: None, normal, Overall Severity Severity of abnormal movements (highest score from questions above): None, normal Incapacitation due to abnormal movements: None, normal Patient's awareness of abnormal movements (rate only patient's report): No Awareness, Dental Status Current problems with teeth and/or dentures?: No Does patient usually wear dentures?: No  CIWA:  CIWA-Ar Total: 0 COWS:     Musculoskeletal: Strength & Muscle Tone: within normal limits Gait & Station: normal Patient leans: N/A  Psychiatric Specialty Exam: Physical Exam  Nursing note and vitals reviewed. Constitutional: She is oriented to person, place, and time. She appears well-developed and well-nourished.  Cardiovascular: Normal rate.  Respiratory: Effort normal.  Neurological: She is alert and oriented to person, place, and time.  Review of Systems  Constitutional: Negative.   Respiratory: Negative for cough and shortness of breath.   Psychiatric/Behavioral: Positive for dysphoric mood. Negative for agitation, behavioral problems, hallucinations, self-injury, sleep disturbance and suicidal ideas. The patient is nervous/anxious. The patient is not hyperactive.     Blood pressure 121/88, pulse (!) 101, temperature 99.1 F (37.3 C), temperature source Oral, resp. rate 16, height 5' (1.524 m), weight 42.6 kg, last menstrual period 05/19/2019.Body mass index is 18.36 kg/m.   General Appearance: Fairly Groomed  Eye Contact:  Good  Speech:  Normal Rate  Volume:  Normal  Mood:  Depressed  Affect:  Congruent  Thought Process:  Coherent and Goal Directed  Orientation:  Full (Time, Place, and Person)  Thought Content:  Logical  Suicidal Thoughts:  No  Homicidal Thoughts:  No  Memory:  Immediate;   Fair Recent;   Good Remote;   Good  Judgement:  Intact  Insight:  Fair  Psychomotor Activity:  Normal  Concentration:  Concentration: Good and Attention Span: Good  Recall:  [employer] of Knowledge:  Fair  Language:  Good  Akathisia:  No  Handed:  Right  AIMS (if indicated):     Assets:  Communication Skills Desire for Improvement Leisure Time Resilience  ADL's:  Intact  Cognition:  WNL  Sleep:  Number of Hours: 6.25     Treatment Plan Summary: Daily contact with patient to assess and evaluate symptoms and progress in treatment and Medication management   Continue inpatient hospitalization.  Increase Seroquel to 100 mg PO QHS for mood instability Continue Ativan 0.5 mg PO Q6HR PRN anxiety Continue Protonix 40 mg PO daily for GERD  Patient will participate in the therapeutic group milieu.  Discharge disposition in progress.   Connye Burkitt, NP 05/21/2019, 11:23 AM   05/21/2019 at 2,45 PM Patient seen by NP and by writer , and case discussed in morning rounds . 24 year old female, presented to hospital voluntarily reporting increasing depression and anxiety for the last few months, recent suicidal ideations without specific plan or intention, worsening neurovegetative symptoms.  Describes also self-referential ideations with feelings that people are criticizing her or talking about her behind her back.  She had been tried on Lexapro a few weeks ago but stopped it due to GI side effects and is not currently on any psychiatric medications.  Endorses intermittent cannabis use.  Denies other drug or alcohol use.  Reports past history of episodes  suggestive of hypomania, and states she believes her father and brother have bipolar disorder.  Reports she is feeling " kind of down today", and affect presents constricted, although smiles at times appropriately during session. States that she is not feeling agitated or angry today ( reports that she had been experiencing these affects often prior to admission). Denies suicidal ideations, and contracts for safety on unit. No current psychotic symptoms noted or reported. She had endorsed some self referential ideations on admission but currently does not endorse . Behavior on unit calm, no disruptive or agitated behaviors.  Thus far tolerating medication ( Seroquel ) well. Labs reviewed - Glucose 105, Lipid Panel Normal ( HDL minimally low at 37) , HgbA1C 5.3, TSH 2.9  BP 121/88, pulse 101 Patient has reported GI symptoms, such as nausea/vomiting , prior to admission. She reports she been vomiting almost daily for the last several weeks. We have reviewed Cannabis Hyperemesis Syndrome with patient and reviewed possible link between regular cannabis use and increased vomiting .  Plan-  continue inpatient treatment. Continue to titrate Seroquel for mood disorder ( to 100 mgrs QHS) . Continue Ativan PRN.    Gabriel Earing MD

## 2019-05-21 NOTE — Progress Notes (Signed)
NUTRITION ASSESSMENT  Pt identified as at risk on the Malnutrition Screen Tool  INTERVENTION: Continue Ensure Enlive po daily, each supplement provides 350 kcal and 20 grams of protein  Recommend MVI with minerals daily  Encourage po intake of meals and snacks   NUTRITION DIAGNOSIS: Unintentional weight loss related to sub-optimal intake as evidenced by pt report.   Goal: Pt to meet >/= 90% of their estimated nutrition needs.  Monitor:  PO intake  Assessment:   RD working remotely.  24 y.o. female presented to hospital voluntarily on 2/20 with reports of feeling increasingly depressed and anxious over the past few months, endorses increased frequency of panic attacks, suicidal ideations without plan or intention and admitted to behavioral health hospital for major depressive disorder.  Per notes, patient reports decreased appetite and endorses 30+ pound wt loss over the last few months. Weights have been trending down over the past 7 months per history. Currently, pt weighs 42.6 kg (93.72 lbs). On 5/27 pt wt 49.4 kg (108.68lbs), on 11/04 pt wt 45.4 kg (99.88 lbs), on 11/30 pt wt 46.7 kg (102.74 lbs), on 2/17 pt wt 43.1 kg (94.82 lbs). Patient has lost 14.96 lbs (13.8%) since November of 2020 which is severe for time frame. Given recent wt history, suspect malnutrition however unable to identify at this time without NFPE and/or dietary recall.   Patient is on a regular diet, noted to have fair appetite and is also offered choice of unit snacks mid-morning and mid-afternoon. Per medication review, patient is provided Ensure supplement daily to aid with calorie/protein needs.  Height: Ht Readings from Last 1 Encounters:  05/19/19 5' (1.524 m)    Weight: Wt Readings from Last 1 Encounters:  05/19/19 42.6 kg    Weight Hx: Wt Readings from Last 10 Encounters:  05/19/19 42.6 kg  05/16/19 43.1 kg  02/26/19 46.7 kg  02/14/19 46.3 kg  01/31/19 45.4 kg  08/23/18 49.4 kg  10/03/17  51 kg  09/05/17 47.9 kg (9 %, Z= -1.37)*  08/30/17 47.6 kg (8 %, Z= -1.41)*  08/23/17 47.2 kg (7 %, Z= -1.48)*   * Growth percentiles are based on CDC (Girls, 2-20 Years) data.    BMI:  Body mass index is 18.36 kg/m. Pt meets criteria for underweight based on current BMI.  Estimated Nutritional Needs: Kcal: 25-30 kcal/kg Protein: > 1 gram protein/kg Fluid: 1 ml/kcal  Diet Order:  Diet Order            Diet regular Room service appropriate? Yes; Fluid consistency: Thin  Diet effective now              Lab results and medications reviewed.   Lajuan Lines, RD, LDN Clinical Nutrition [place] [phone] After Hours/Weekend Pager # in [phone]

## 2019-05-21 NOTE — BHH Suicide Risk Assessment (Signed)
[facility] INPATIENT:  Family/Significant Other Suicide Prevention Education  Suicide Prevention Education:  Contact Attempts: with fiance, Bonnee Quin  [phone]) has been identified by the patient as the family member/significant other with whom the patient will be residing, and identified as the person(s) who will aid the patient in the event of a mental health crisis.  With written consent from the patient, two attempts were made to provide suicide prevention education, prior to and/or following the patient's discharge.  We were unsuccessful in providing suicide prevention education.  A suicide education pamphlet was given to the patient to share with family/significant other.  Date and time of first attempt: 05/21/2019/10:36am; Unable to leave voicemail. Voicemail not set up at this time.   Date and time of second attempt: Needs to be done  Marylee Floras 05/21/2019, 10:36 AM

## 2019-05-21 NOTE — BHH Suicide Risk Assessment (Signed)
[facility] INPATIENT:  Family/Significant Other Suicide Prevention Education  Suicide Prevention Education:  Education Completed; with fiance, Sydney Higgins  [phone]) has been identified by the patient as the family member/significant other with whom the patient will be residing, and identified as the person(s) who will aid the patient in the event of a mental health crisis (suicidal ideations/suicide attempt).  With written consent from the patient, the family member/significant other has been provided the following suicide prevention education, prior to the and/or following the discharge of the patient.  The suicide prevention education provided includes the following:  Suicide risk factors  Suicide prevention and interventions  National Suicide Hotline telephone number  [facility] assessment telephone number  [facility] Emergency Assistance [place] and/or Residential Mobile Crisis Unit telephone number  Request made of family/significant other to:  Remove weapons (e.g., guns, rifles, knives), all items previously/currently identified as safety concern.    Remove drugs/medications (over-the-counter, prescriptions, illicit drugs), all items previously/currently identified as a safety concern.  The family member/significant other verbalizes understanding of the suicide prevention education information provided.  The family member/significant other agrees to remove the items of safety concern listed above.  Sydney Higgins reports he is concerned about the patient's eating habits. He reports the patient has not eaten "appropriately" for the last 4-5 months. He states anytime the patient eats, she cannot "hold her food down". Sydney Higgins states the patient has lost a lot of weight in the last 4-5 months as well.   Sydney Higgins reports he is ready for the patient to come home, however he wants her to receive the help she needs. He reported he did not have any further  questions or concerns at this time.   Sydney Higgins 05/21/2019, 1:33 PM

## 2019-05-21 NOTE — BHH Group Notes (Signed)
LCSW Group Therapy Notes 05/21/2019 2:26 PM  Type of Therapy and Topic: Group Therapy: Overcoming Obstacles  Participation Level: Did Not Attend  Description of Group:  In this group patients will be encouraged to explore what they see as obstacles to their own wellness and recovery. They will be guided to discuss their thoughts, feelings, and behaviors related to these obstacles. The group will process together ways to cope with barriers, with attention given to specific choices patients can make. Each patient will be challenged to identify changes they are motivated to make in order to overcome their obstacles. This group will be process-oriented, with patients participating in exploration of their own experiences as well as giving and receiving support and challenge from other group members.  Therapeutic Goals: 1. Patient will identify personal and current obstacles as they relate to admission. 2. Patient will identify barriers that currently interfere with their wellness or overcoming obstacles.  3. Patient will identify feelings, thought process and behaviors related to these barriers. 4. Patient will identify two changes they are willing to make to overcome these obstacles:   Summary of Patient Progress   Therapeutic Modalities:  Cognitive Behavioral Therapy Solution Focused Therapy Motivational Interviewing Relapse Prevention [place], MSW, [facility] 05/21/2019 2:26 PM

## 2019-05-21 NOTE — Progress Notes (Signed)
Recreation Therapy Notes  Date:  2.22.21 Time: 0930 Location: [address] Group Room  Group Topic: Stress Management  Goal Area(s) Addresses:  Patient will identify positive stress management techniques. Patient will identify benefits of using stress management post d/c.  Behavioral Response: Engaged  Intervention: Stress Management  Activity : Meditation.  LRT played a meditation that focused on making the most of your day.  Patients were to listen and follow along as the meditation played to engage in the activity.    Education:  Stress Management, Discharge Planning.   Education Outcome: Acknowledges Education  Clinical Observations/Feedback: Pt attended and participated in activity.    Victorino Sparrow, LRT/CTRS    Victorino Sparrow A 05/21/2019 11:25 AM

## 2019-05-22 DIAGNOSIS — F339 Major depressive disorder, recurrent, unspecified: Secondary | ICD-10-CM

## 2019-05-22 LAB — URINALYSIS, COMPLETE (UACMP) WITH MICROSCOPIC
Bacteria, UA: NONE SEEN
Bilirubin Urine: NEGATIVE
Glucose, UA: NEGATIVE mg/dL
Hgb urine dipstick: NEGATIVE
Ketones, ur: 5 mg/dL — AB
Leukocytes,Ua: NEGATIVE
Nitrite: NEGATIVE
Protein, ur: NEGATIVE mg/dL
Specific Gravity, Urine: 1.027 (ref 1.005–1.030)
pH: 6 (ref 5.0–8.0)

## 2019-05-22 MED ORDER — QUETIAPINE FUMARATE 200 MG PO TABS
200.0000 mg | ORAL_TABLET | Freq: Every day | ORAL | Status: DC
  Administered 2019-05-22 – 2019-05-23 (×2): 200 mg via ORAL
  Filled 2019-05-22: qty 7
  Filled 2019-05-22 (×2): qty 1
  Filled 2019-05-22: qty 7
  Filled 2019-05-22: qty 1

## 2019-05-22 MED ORDER — CLONIDINE HCL 0.1 MG PO TABS
0.1000 mg | ORAL_TABLET | Freq: Three times a day (TID) | ORAL | Status: DC | PRN
  Administered 2019-05-22: 0.1 mg via ORAL
  Filled 2019-05-22: qty 1

## 2019-05-22 MED ORDER — ZIPRASIDONE MESYLATE 20 MG IM SOLR
10.0000 mg | Freq: Once | INTRAMUSCULAR | Status: AC
  Administered 2019-05-22: 10 mg via INTRAMUSCULAR
  Filled 2019-05-22: qty 20

## 2019-05-22 MED ORDER — GABAPENTIN 300 MG PO CAPS
300.0000 mg | ORAL_CAPSULE | Freq: Three times a day (TID) | ORAL | Status: DC
  Administered 2019-05-22 – 2019-05-24 (×6): 300 mg via ORAL
  Filled 2019-05-22: qty 1
  Filled 2019-05-22: qty 21
  Filled 2019-05-22: qty 1
  Filled 2019-05-22: qty 21
  Filled 2019-05-22 (×4): qty 1
  Filled 2019-05-22: qty 21
  Filled 2019-05-22: qty 1
  Filled 2019-05-22: qty 21
  Filled 2019-05-22: qty 1
  Filled 2019-05-22: qty 21
  Filled 2019-05-22: qty 1
  Filled 2019-05-22: qty 21
  Filled 2019-05-22: qty 1

## 2019-05-22 MED ORDER — ZIPRASIDONE MESYLATE 20 MG IM SOLR
INTRAMUSCULAR | Status: AC
  Filled 2019-05-22: qty 20

## 2019-05-22 MED ORDER — BUSPIRONE HCL 5 MG PO TABS
5.0000 mg | ORAL_TABLET | Freq: Three times a day (TID) | ORAL | Status: DC
  Administered 2019-05-22: 5 mg via ORAL
  Filled 2019-05-22 (×6): qty 1

## 2019-05-22 NOTE — Progress Notes (Signed)
Recreation Therapy Notes  Animal-Assisted Activity (AAA) Program Checklist/Progress Notes Patient Eligibility Criteria Checklist & Daily Group note for Rec Tx Intervention  Date: 2.23.21 Time: 79 Location: [address]   AAA/T Program Assumption of Risk Form signed by Teacher, music or Parent Legal Guardian  YES   Patient is free of allergies or sever asthma YES   Patient reports no fear of animals  YES   Patient reports no history of cruelty to animals  YES   Patient understands his/her participation is voluntary  YES  Patient washes hands before animal contact  YES    Patient washes hands after animal contact YES   Behavioral Response: Engaged  Education: Contractor, Appropriate Animal Interaction   Education Outcome: Acknowledges understanding/In group clarification offered/Needs additional education.   Clinical Observations/Feedback:  Pt attended and participated in activity.   Victorino Sparrow, LRT/CTRS         Victorino Sparrow A 05/22/2019 3:43 PM

## 2019-05-22 NOTE — Progress Notes (Signed)
D:  Patient denied SI and HI, contracts for safety.  Denied A/V hallucinations.  Denied pain. A:  Medications administered per MD orders.  Emotional support and encouragement given patient. R:  Safety maintained with 15 minute checks.  Patient is feeling better this afternoon.  Patient called her mom and expressed her feelings over the phone conversation which upset patient this morning.

## 2019-05-22 NOTE — Progress Notes (Signed)
   05/22/19 2000  Psych Admission Type (Psych Patients Only)  Admission Status Voluntary  Psychosocial Assessment  Patient Complaints Anxiety  Eye Contact Fair  Facial Expression Anxious  Affect Appropriate to circumstance  Speech Logical/coherent  Interaction Assertive  Motor Activity Other (Comment) (WDL)  Appearance/Hygiene Unremarkable  Behavior Characteristics Cooperative  Mood Anxious  Thought Process  Coherency WDL  Content WDL  Delusions None reported or observed  Perception WDL  Hallucination None reported or observed  Judgment Impaired  Confusion None  Danger to Self  Current suicidal ideation? Denies  Danger to Others  Danger to Others None reported or observed

## 2019-05-22 NOTE — Progress Notes (Signed)
Adult Psychoeducational Group Note  Date:  05/22/2019 Time:  9:36 PM  Group Topic/Focus:  Wrap-Up Group:   The focus of this group is to help patients review their daily goal of treatment and discuss progress on daily workbooks.  Participation Level:  Active  Participation Quality:  Appropriate  Affect:  Appropriate  Cognitive:  Appropriate  Insight: Appropriate  Engagement in Group:  Engaged  Modes of Intervention:  Discussion  Additional Comments:  Patient attended group and said that her day was a 5.  Patient was concerned about her medications. Patient said she requested the physician to increase the dosage of her medications, but she was told by her first shift nurse that the physician did not increase the dosage, instead he added another drug. Staff suggested  patient verify the information with her night shift nurse.   Sydney Higgins W Khamari Sheehan [id number], 9:36 PM

## 2019-05-22 NOTE — BHH Counselor (Signed)
CSW met with patient on unit per patient request. Patient expressed that she was upset earlier regarding her medications, as she feels some improvement in mood and affect and hoped to increase the dosage of her current medications.  Patient also inquired about documentation for obtaining an emotional support or service animal. CSW briefly reviewed the differences with patient and provided patient with printed information. Patient voiced appreciation.  Stephanie Acre, MSW, [place] Social Worker [facility]  [phone]

## 2019-05-22 NOTE — Progress Notes (Signed)
Patient became very upset this morning after talking to mother over phone.  Mother said bad things to her, worthless, etc.  Geodon 10 mg IM R arm given to patient per MD order.  Patient continues to lay in bed resting.
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
## 2019-05-23 NOTE — BHH Group Notes (Signed)
Pt attended [place] meeting via [place]

## 2019-05-23 NOTE — [facility] Group Notes (Signed)
LCSW Group Therapy Note 05/23/2019 2:30 PM  Type of Therapy/Topic: Group Therapy: Feelings about Diagnosis  Participation Level: Active   Description of Group:  This group will allow patients to explore their thoughts and feelings about diagnoses they have received. Patients will be guided to explore their level of understanding and acceptance of these diagnoses. Facilitator will encourage patients to process their thoughts and feelings about the reactions of others to their diagnosis and will guide patients in identifying ways to discuss their diagnosis with significant others in their lives. This group will be process-oriented, with patients participating in exploration of their own experiences, giving and receiving support, and processing challenge from other group members.  Therapeutic Goals: 1. Patient will demonstrate understanding of diagnosis as evidenced by identifying two or more symptoms of the disorder 2. Patient will be able to express two feelings regarding the diagnosis 3. Patient will demonstrate their ability to communicate their needs through discussion and/or role play  Summary of Patient Progress:  Sydney Higgins was engaged and participated throughout the group session. Sydney Higgins states that she struggled with "manic bipolar". Sydney Higgins reports that her diagnosis causes her to have "explosive episodes" and she avoids interaction with others to prevent any issues. Sydney Higgins reports her family does not understand mental health issues, however her fiance is "extremely" supportive. Sydney Higgins reports she would like to learn better coping skills for life stressors, in order to prevent her "explosive episodes" in the future.     Therapeutic Modalities:  Cognitive Behavioral Therapy Brief Therapy Feelings Identification    [place] Clinical Social Worker
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
## 2019-05-23 NOTE — Progress Notes (Signed)
Recreation Therapy Notes  Date:  2.24.21 Time: 0930 Location: [address] Dayroom  Group Topic: Stress Management  Goal Area(s) Addresses:  Patient will identify positive stress management techniques. Patient will identify benefits of using stress management post d/c.  Intervention: Stress Management  Activity :  Guided Imagery.  LRT read a script that took patients on a walk through a summer meadow.  Patients were to listen and follow along as script was read to participate in activity.   Education:  Stress Management, Discharge Planning.   Education Outcome: Acknowledges Education  Clinical Observations/Feedback: Pt did not attend group session.    Victorino Sparrow, LRT/CTRS         Victorino Sparrow A 05/23/2019 11:14 AM

## 2019-05-24 MED ORDER — QUETIAPINE FUMARATE 200 MG PO TABS: 200.0000 mg | ORAL_TABLET | Freq: Every day | ORAL | 0 refills | Status: DC

## 2019-05-24 MED ORDER — FAMOTIDINE 20 MG PO TABS: 20.0000 mg | ORAL_TABLET | Freq: Two times a day (BID) | ORAL | 0 refills | Status: DC

## 2019-05-24 MED ORDER — SUCRALFATE 1 GM/10ML PO SUSP: 1.0000 g | Freq: Three times a day (TID) | ORAL | Status: DC

## 2019-05-24 MED ORDER — GABAPENTIN 300 MG PO CAPS: 300.0000 mg | ORAL_CAPSULE | Freq: Three times a day (TID) | ORAL | 0 refills | Status: DC

## 2019-05-24 MED ORDER — SUCRALFATE 1 GM/10ML PO SUSP: 1.0000 g | Freq: Three times a day (TID) | ORAL | 0 refills | Status: DC

## 2019-05-24 MED ORDER — NICOTINE 21 MG/24HR TD PT24: 21.0000 mg | MEDICATED_PATCH | Freq: Every day | TRANSDERMAL | 0 refills | Status: DC

## 2019-05-24 MED ORDER — FAMOTIDINE 20 MG PO TABS
20.0000 mg | ORAL_TABLET | Freq: Two times a day (BID) | ORAL | Status: DC
  Filled 2019-05-24: qty 14

## 2019-05-24 NOTE — Progress Notes (Signed)
Discharge Note:  Patient discharged with lyft.  Patient denied SI and HI.  Denied A/V hallucinations.  Suicide prevention information given and discussed with patient who stated she understood and had no questions.  Patient stated she appreciated all assistance received from [facility] staff.  Patient stated she received all her belongings, clothing, toiletries, etc.  All required discharge information given to patient at discharge.

## 2019-05-24 NOTE — Discharge Summary (Signed)
Physician Discharge Summary Note  Patient:  Sydney Higgins is an 24 y.o., female  MRN:  [id number]  DOB:  [birth year]  Patient phone:  [phone] (home)   Patient address:   [place] [address],   Total Time spent with patient: Greater than 30 minutes  Date of Admission:  05/19/2019  Date of Discharge: 05-24-19  Reason for Admission: Worsening symptoms of depression, panic attacks & suicdal ideations.  Principal Problem: MDD (major depressive disorder), recurrent episode, severe ([place])  Discharge Diagnoses: Principal Problem:   MDD (major depressive disorder), recurrent episode, severe ([place]) Active Problems:   MDD (major depressive disorder)  Past Psychiatric History: Major depressive disorder  Past Medical History:  Past Medical History:  Diagnosis Date  . Anxiety   . 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 DMDD (disruptive mood dysregulation disorder) ([place]) 12/11/2015  . Headache   . Insomnia 12/11/2015  . 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 MASS EXCISION Left 08/26/2017   Procedure: EXCISION SUPERFICIAL MASS FROM LEFT SIDE OF NECK;  Surgeon: Virl Cagey, MD;  Location: [facility];  Service: General;  Laterality: Left;   Family History:  Family History  Problem Relation Age of Onset  . Cancer Paternal Grandfather   . Cancer Paternal Grandmother   . Diabetes Maternal Grandmother   . Diabetes Maternal Grandfather   . Hypertension Father   . Bipolar disorder Brother   . Deafness Sister    Family Psychiatric  History: See H&P  Social History:  Social History   Substance and Sexual Activity  Alcohol Use Not Currently     Social History   Substance and Sexual Activity  Drug Use Yes  . Types: Marijuana   Comment: occ    Social History   Socioeconomic History  . Marital status: Significant Other    Spouse name: Not on file  . Number of  children: Not on file  . Years of education: Not on file  . Highest education level: Not on file  Occupational History  . Not on file  Tobacco Use  . Smoking status: Former Smoker    Types: E-cigarettes  . Smokeless tobacco: Never Used  Substance and Sexual Activity  . Alcohol use: Not Currently  . Drug use: Yes    Types: Marijuana    Comment: occ  . Sexual activity: Yes    Birth control/protection: None  Other Topics Concern  . Not on file  Social History Narrative  . Not on file   Social Determinants of Health   Financial Resource Strain:   . Difficulty of Paying Living Expenses: Not on file  Food Insecurity:   . Worried About Charity fundraiser in the Last Year: Not on file  . Ran Out of Food in the Last Year: Not on file  Transportation Needs:   . Lack of Transportation (Medical): Not on file  . Lack of Transportation (Non-Medical): Not on file  Physical Activity:   . Days of Exercise per Week: Not on file  . Minutes of Exercise per Session: Not on file  Stress:   . Feeling of Stress : Not on file  Social Connections:   . Frequency of Communication with Friends and Family: Not on file  . Frequency of Social Gatherings with Friends and Family: Not on file  . Attends Religious Services: Not on file  . Active Member  of Clubs or Organizations: Not on file  . Attends Archivist Meetings: Not on file  . Marital Status: Not on file   Hospital Course: (Per Md's admission evaluation): 24 year old female, presented to hospital voluntarily on 05/19/19. Reports she has been feeling increasingly depressed and anxious over the last few months. She also endorses recent increased frequency of  panic attacks. She reports recent suicidal ideations, without plan or intention. Describes neuro-vegetative symptoms as below. She describes contributing stressors, mainly current homelessness, unemployment, (was laid off 8-9 months ago, which she attributes to [place]) . States  " I used to have things, a cell phone, a laptop, an apartment, a pet, I lost all that stuff and I still haven't come to terms with it". She reports intermittent cannabis use, currently denies alcohol or other drug abuse. She states she had been prescribed Lexapro a few weeks ago, but reports she took only for a few weeks because it caused GI side effects. She has been off it and on no psychiatric medications for several weeks.    This istheseconddischarge summary from this [facility] for this61year old [place] female. She was a patient in this [facility] in 2017 during her teen years. She was admitted & received treatment at the Adolescence unit with similar presentation.Kealy hasprevious hx of Major depressive disorder.She was admitted to the [facility] this time around for worsening symptoms of depression, panic attacks & development of suicidal ideations. She cited unemployment status, homelessness & loss of her personal belongings since she was laid off from her job over 8 months ago due to the Covid pandemic. She was in need of mood stabilization treatments.  After evaluation of herpresenting symptoms, it was jointly agreed by the treatment team torecommend Dejafor mood stabilization treatments. The medication regimentargeting those presenting symptomswere discussed &initiatedwith herconsent. She was treated & stabilized on themedicationsas listed below on her discharge medication lists. She was also enrolled & participated in the group counseling sessions being offered & held on this unit.She learned coping skills.She presented other significant preexisting medical issues that required treatment  & or monitoring.She was resumed & discharged on all thosepertinent home medications for those health issues. She tolerated hertreatment regimen without any adverse effects or reactions reported.   Jen's symptoms responded well to hertreatment regimen. This is evidenced by herreports of improved mood, resolution  of symptoms &presentation of good affect/eye contact.Sheiscurrently mentally & medically stable for dischargeto continue mental health careas recommended below.   Today upon discharge evaluationwith the attending psychiatrist,Lovey shares, "I'm doing good. I feel much better".She denies any specific concerns.She is sleeping well. Herappetite is good. She denies other physical complaints.She denies SI/HI/AH/VH.She is tolerating hermedications well &in agreement to continue hercurrent regimen as noted on the discharge medication lists below.She will follow up for routine psychiatric care&medication management as noted below. She is provided with all the necessary information needed to make this appointment without problems. She was able to engage in safety planning including plan to return to [facility] or contact emergency services if she feels unable to maintain herown safety or the safety of others. Pt had no further questions, comments or concerns. She left [facility] with all personal belongings in no apparent distress.Transportation per the [facility] safe transportation services.  Physical Findings: AIMS: Facial and Oral Movements Muscles of Facial Expression: None, normal Lips and Perioral Area: None, normal Jaw: None, normal Tongue: None, normal,Extremity Movements Upper (arms, wrists, hands, fingers): None, normal Lower (legs, knees, ankles, toes): None, normal, Trunk Movements  Neck, shoulders, hips: None, normal, Overall Severity Severity of abnormal movements (highest score from questions above): None, normal Incapacitation due to abnormal movements: None, normal Patient's awareness of abnormal movements (rate only patient's report): No Awareness, Dental Status Current problems with teeth and/or dentures?: No Does patient usually wear dentures?: No  CIWA:  CIWA-Ar Total: 0 COWS:     Musculoskeletal: Strength & Muscle Tone: within normal limits Gait & Station: normal Patient leans:  N/A  Psychiatric Specialty Exam: Physical Exam  Nursing note and vitals reviewed. Constitutional: She is oriented to person, place, and time. She appears well-developed.  HENT:  Head: Normocephalic.  Cardiovascular: Normal rate.  Respiratory: Effort normal.  Genitourinary:    Genitourinary Comments: Deferred   Musculoskeletal:        General: Normal range of motion.     Cervical back: Normal range of motion.  Neurological: She is alert and oriented to person, place, and time.  Skin: Skin is warm and dry.    Review of Systems  Constitutional: Negative for chills, diaphoresis and fever.  HENT: Negative for congestion, sneezing and sore throat.   Respiratory: Negative for cough, shortness of breath and wheezing.   Cardiovascular: Negative for chest pain and palpitations.  Gastrointestinal: Negative for diarrhea, nausea and vomiting.  Genitourinary: Negative for difficulty urinating.  Musculoskeletal: Negative for myalgias.  Allergic/Immunologic: Positive for environmental allergies (Bee venom) and food allergies (Apple, Banana, Apple).  Neurological: Negative for dizziness, tremors, seizures, syncope, light-headedness, numbness and headaches.  Psychiatric/Behavioral: Positive for dysphoric mood (Stabilized with medication prior to discharge). Negative for agitation, behavioral problems, confusion, decreased concentration, hallucinations, self-injury, sleep disturbance and suicidal ideas. The patient is not nervous/anxious (Stable) and is not hyperactive.     Blood pressure 119/85, pulse 92, temperature 97.9 F (36.6 C), temperature source Oral, resp. rate 16, height 5' (1.524 m), weight 42.6 kg, last menstrual period 05/19/2019, SpO2 100 %.Body mass index is 18.36 kg/m.  See Md's discharge SRA  Sleep:  Number of Hours: 6.25   Have you used any form of tobacco in the last 30 days? (Cigarettes, Smokeless Tobacco, Cigars, and/or Pipes): Yes  Has this patient used any form of tobacco in  the last 30 days? (Cigarettes, Smokeless Tobacco, Cigars, and/or Pipes): Yes, an FDA-approved tobacco cessation medication was recommended at discharge.  Blood Alcohol level:  Lab Results  Component Value Date   ETH <10 05/19/2019   ETH <10 [id number]    Metabolic Disorder Labs:  Lab Results  Component Value Date   HGBA1C 5.3 05/21/2019   MPG 105.41 05/21/2019   MPG 103 12/12/2015   No results found for: PROLACTIN Lab Results  Component Value Date   CHOL 121 05/21/2019   TRIG 34 05/21/2019   HDL 37 (L) 05/21/2019   CHOLHDL 3.3 05/21/2019   VLDL 7 05/21/2019   LDLCALC 77 05/21/2019   LDLCALC 68 12/12/2015   See Psychiatric Specialty Exam and Suicide Risk Assessment completed by Attending Physician prior to discharge.  Discharge destination:  Home  Is patient on multiple antipsychotic therapies at discharge:  No   Has Patient had three or more failed trials of antipsychotic monotherapy by history:  No  Recommended Plan for Multiple Antipsychotic Therapies: NA  Allergies as of 05/24/2019      Reactions   Apple Other (See Comments)   Can eat the fruit, but not the peel Mouth itches   Banana Other (See Comments)   Pt prefrence   Bee Venom Swelling   Pear Other (See  Comments)   Mouth itches      Medication List    STOP taking these medications   amphetamine-dextroamphetamine 20 MG tablet Commonly known as: ADDERALL   escitalopram 10 MG tablet Commonly known as: Lexapro   ondansetron 8 MG disintegrating tablet Commonly known as: Zofran ODT     TAKE these medications     Indication  famotidine 20 MG tablet Commonly known as: PEPCID Take 1 tablet (20 mg total) by mouth 2 (two) times daily. For acid reflux What changed: additional instructions  Indication: Ulcer of the Duodenum   gabapentin 300 MG capsule Commonly known as: NEURONTIN Take 1 capsule (300 mg total) by mouth 3 (three) times daily. For agitation  Indication: Agitation   nicotine 21 mg/24hr  patch Commonly known as: NICODERM CQ - dosed in mg/24 hours Place 1 patch (21 mg total) onto the skin daily. (May buy from over the counter): For smoking cessation Start taking on: May 25, 2019  Indication: Nicotine Addiction   QUEtiapine 200 MG tablet Commonly known as: SEROQUEL Take 1 tablet (200 mg total) by mouth at bedtime. For mood control  Indication: Mood control   sucralfate 1 GM/10ML suspension Commonly known as: Carafate Take 10 mLs (1 g total) by mouth 4 (four) times daily -  with meals and at bedtime. For stomach ulcer What changed: additional instructions  Indication: Ulcer of the Duodenum, Stomach Ulcer      Follow-up Information    [facility], [facility]. Go on 05/25/2019.   Why: You are scheduled for an appointment on 05/25/19 at 10:00 am.  This appointment will be in person.  Please have your insurance information and your discharge summary available. Contact information: [place] [address] [phone]          Follow-up recommendations: Activity:  As tolerated Diet: As recommended by your primary care doctor. Keep all scheduled follow-up appointments as recommended.  Comments: Prescriptions given at discharge.  Patient agreeable to plan.  Given opportunity to ask questions.  Appears to feel comfortable with discharge denies any current suicidal or homicidal thought. Patient is also instructed prior to discharge to: Take all medications as prescribed by his/her mental healthcare provider. Report any adverse effects and or reactions from the medicines to his/her outpatient provider promptly. Patient has been instructed & cautioned: To not engage in alcohol and or illegal drug use while on prescription medicines. In the event of worsening symptoms, patient is instructed to call the crisis hotline, 911 and or go to the nearest ED for appropriate evaluation and treatment of symptoms. To follow-up with his/her primary care provider for  your other medical issues, concerns and or health care needs.  Signed: Lindell Spar, NP, PMHNP, FNP-BC 05/24/2019, 9:05 AM

## 2019-05-24 NOTE — Progress Notes (Signed)
  [facility] Adult Case Management Discharge Plan :  Will you be returning to the same living situation after discharge:  Yes,  home with friends. At discharge, do you have transportation home?: Yes,  using [facility] [employer]. Do you have the ability to pay for your medications: No. Provided samples.   Release of information consent forms completed and in the chart.  Patient to Follow up at: Follow-up Information    [facility]. Go on 05/25/2019.   Why: You are scheduled for an appointment on 05/25/19 at 10:00 am.  This appointment will be in person.  Please have your insurance information and your discharge summary available. Contact information: [place] [address] [phone]           Next level of care provider has access to [place] and Suicide Prevention discussed: Yes,  with fiance.  Have you used any form of tobacco in the last 30 days? (Cigarettes, Smokeless Tobacco, Cigars, and/or Pipes): Yes  Has patient been referred to the Quitline?: Patient refused referral  Patient has been referred for addiction treatment: Yes  Joellen Jersey, [place] 05/24/2019, 9:10 AM

## 2019-05-24 NOTE — Progress Notes (Signed)
Adult Psychoeducational Group Note  Date:  05/24/2019 Time:  10:33 AM  Group Topic/Focus:  Crisis Planning:   The purpose of this group is to help patients create a crisis plan for use upon discharge or in the future, as needed.  Participation Level:  Active  Participation Quality:  Appropriate  Affect:  Appropriate  Cognitive:  Alert  Insight: Appropriate  Engagement in Group:  Engaged  Modes of Intervention:  Discussion and Education  Additional Comments:    This staff member had a 1 on 1 session with this pt. Pt discussed her plans for discharge tomorrow and how she prevent relapse.   [name] 05/24/2019, 10:33 AM

## 2019-05-24 NOTE — [facility] Suicide Risk Assessment (Signed)
[facility] Discharge Suicide Risk Assessment   Principal Problem: <principal problem not specified> Discharge Diagnoses: Active Problems:   MDD (major depressive disorder)   Total Time spent with patient: 20 minutes  Musculoskeletal: Strength & Muscle Tone: within normal limits Gait & Station: normal Patient leans: N/A  Psychiatric Specialty Exam: Review of Systems  All other systems reviewed and are negative.   Blood pressure 119/85, pulse 92, temperature 97.9 F (36.6 C), temperature source Oral, resp. rate 16, height 5' (1.524 m), weight 42.6 kg, last menstrual period 05/19/2019, SpO2 100 %.Body mass index is 18.36 kg/m.  General Appearance: Casual  Eye Contact::  Good  Speech:  Normal Rate409  Volume:  Normal  Mood:  Euthymic  Affect:  Congruent  Thought Process:  Coherent and Descriptions of Associations: Intact  Orientation:  Full (Time, Place, and Person)  Thought Content:  Logical  Suicidal Thoughts:  No  Homicidal Thoughts:  No  Memory:  Immediate;   Fair Recent;   Fair Remote;   Fair  Judgement:  Intact  Insight:  Fair  Psychomotor Activity:  Normal  Concentration:  Good  Recall:  Good  Fund of Knowledge:Good  Language: Good  Akathisia:  Negative  Handed:  Right  AIMS (if indicated):     Assets:  Communication Skills Desire for Improvement Housing Resilience  Sleep:  Number of Hours: 6.25  Cognition: WNL  ADL's:  Intact   Mental Status Per Nursing Assessment::   On Admission:  Suicidal ideation indicated by patient, Suicidal ideation indicated by others  Demographic Factors:  Low socioeconomic status and Unemployed  Loss Factors: Financial problems/change in socioeconomic status  Historical Factors: Impulsivity  Risk Reduction Factors:   Living with another person, especially a relative  Continued Clinical Symptoms:  Bipolar Disorder:   Mixed State  Cognitive Features That Contribute To Risk:  Thought constriction (tunnel vision)     Suicide Risk:  Minimal: No identifiable suicidal ideation.  Patients presenting with no risk factors but with morbid ruminations; may be classified as minimal risk based on the severity of the depressive symptoms  Follow-up Information    [facility]. Go on 05/25/2019.   Why: You are scheduled for an appointment on 05/25/19 at 10:00 am.  This appointment will be in person.  Please have your insurance information and your discharge summary available. Contact information: [place] [address] [phone]           Plan Of Care/Follow-up recommendations:  Activity:  ad lib  Sharma Covert, MD 05/24/2019, 7:44 AM

## 2019-05-24 NOTE — Progress Notes (Signed)
   05/23/19 2347  Psych Admission Type (Psych Patients Only)  Admission Status Voluntary  Psychosocial Assessment  Patient Complaints None  Eye Contact Fair  Facial Expression Animated  Affect Appropriate to circumstance  Speech Logical/coherent  Interaction Assertive  Appearance/Hygiene Unremarkable  Behavior Characteristics Cooperative;Appropriate to situation  Mood Pleasant (reports mood much better)  Thought Process  Coherency WDL  Content WDL  Delusions WDL  Perception WDL  Hallucination None reported or observed  Judgment WDL  Confusion WDL  Danger to Self  Current suicidal ideation? Denies  Danger to Others  Danger to Others None reported or observed

## 2019-05-24 NOTE — Progress Notes (Signed)
D:  Patient's self inventory sheet, patient sleeps good, no sleep medication.  Fair appetite, normal energy level, good concentration.  Rated depression and hopeless 1, anxiety 3.  Denied withdrawals.  Denied SI.  Denied physical problems.  Denied physical pain.  Goal is discharge and follow up with meds.  Plans to talk to MD/SW.  "You all have been very wonderful to me, guide me in right direction.  So again, THANK YOU!!" A:  Medications administered per MD orders.  Emotional support and encouragement given patient. R:  Safety maintained with 15 minute checks.  Denied SI and HI, contracts for safety.  Denied A/V hallucinations.  Denied pain.

## 2019-09-25 ENCOUNTER — Encounter: Payer: Self-pay | Admitting: Adult Health

## 2019-09-25 ENCOUNTER — Ambulatory Visit (INDEPENDENT_AMBULATORY_CARE_PROVIDER_SITE_OTHER): Payer: Self-pay | Admitting: Adult Health

## 2019-09-25 VITALS — BP 138/93 | HR 57 | Ht 60.0 in | Wt 103.5 lb

## 2019-09-25 DIAGNOSIS — Z319 Encounter for procreative management, unspecified: Secondary | ICD-10-CM

## 2019-09-25 MED ORDER — PRENATAL PLUS 27-1 MG PO TABS: 1.0000 | ORAL_TABLET | Freq: Every day | ORAL | 12 refills | Status: DC

## 2019-09-25 MED ORDER — PRENATAL PLUS 27-1 MG PO TABS: 1.0000 | ORAL_TABLET | Freq: Every day | ORAL | 0 refills | Status: DC

## 2019-09-25 NOTE — Progress Notes (Addendum)
  Subjective:     Patient ID: Sydney Higgins, female   DOB: December 30, 1995, 24 y.o.   MRN: [id number]  HPI Sydney Higgins is a 24 year old black female,with SO, G2P0020, in to discuss getting pregnant, has been trying for about a year,partner has 3 children.Periods are regular.She is not taking Seroquel and only uses gabapentin prn.Marland Kitchen PCP is Dr Cindie Laroche.  Review of Systems Periods regular Reviewed past medical,surgical, social and family history. Reviewed medications and allergies.     Objective:   Physical Exam BP (!) 138/93 (BP Location: Right Arm, Patient Position: Sitting, Cuff Size: Normal)   Pulse (!) 57   Ht 5' (1.524 m)   Wt 103 lb 8 oz (46.9 kg)   LMP 09/07/2019   BMI 20.21 kg/m  Skin warm and dry. Neck: mid line trachea, normal thyroid, good ROM, no lymphadenopathy noted. Lungs: clear to ausculation bilaterally. Cardiovascular: regular rate and rhythm. Fall risk is low     Assessment:     1. Patient desires pregnancy Will rx prenatal vitamins Check progesterone level 7/1 Discussed timing of sex,pee before sex and lay there 20 minutes after sex Also discussed clomid use and gave handout     Plan:     Check progesterone level 7/1 will talk after that, she has order

## 2019-09-25 NOTE — Patient Instructions (Signed)
CLOMID INSTRUCTIONS  WHY USE IT? Clomid helps your ovaries to release eggs (ovulate).  HOW TO USE IT? Clomid is taken as a pill usually on days 5,6,7,8, & 9 of your cycle.  Day 1 is the first day of your period. The dose or duration may be changed to achieve ovulation.  Provera (progesterone) may first be used to bring on a period for some patients. The day of ovulation on Clomid is usually between cycle day 14 and 17.  Having sexual intercourse at least every other day between cycle day 13 and 18 will improve your chances of becoming pregnant during the Clomid cycle.  You may monitor your ovulation using basal body temperature charts or with ovulation kits.  If using the ovulation predictor kits, having intercourse the day of the surge and the two days following is recommended. If you get your period, call when it starts for an appointment with your doctor, so that an exam may be done, and another Clomid cycle can be considered if appropriate. If you do not get a period by day 35 of the cycle, please get a blood pregnancy test.  If it is negative, speak to your doctor for instructions to bring on another period and to plan a follow-up appointment.  THINGS TO KNOW: If you get pregnant while using Clomid, your chance of twins is 7%m and triplets is less than 1%. Some studies have suggested the use of "fertility drugs" may increase your risk of ovarian cancers in the future.  It is unclear if these drugs increase the risk, or people who have problems with fertility are prone for these cancers.  If there is an actual risk, it is very low.  If you have a history of liver problems or ovarian cancer, it may be wise to avoid this medication.  SIDE EFFECTS: The most common side effect is hot flashes (20%). Breast tenderness, headaches, nausea, bloating may also occur at different times. Less than 3/1,000 people have dryness or loss of hair. Persistent ovarian cysts may form from the use of this  medication. Ovarian hyperstimulation syndrome is a rare side effect at low doses. Visual changes like flashes of light or blurring.   

## 2019-09-28 LAB — PROGESTERONE: Progesterone: 4.8 ng/mL

## 2019-10-04 ENCOUNTER — Telehealth: Payer: Self-pay | Admitting: Adult Health

## 2019-10-04 NOTE — Telephone Encounter (Signed)
Patient called stating that she would like a call back from [place] regarding her Clomid medication and when she is suppose to begin them. Please contact pt

## 2019-10-09 ENCOUNTER — Telehealth: Payer: Self-pay | Admitting: Adult Health

## 2019-10-09 DIAGNOSIS — Z319 Encounter for procreative management, unspecified: Secondary | ICD-10-CM

## 2019-10-09 MED ORDER — CLOMIPHENE CITRATE 50 MG PO TABS: ORAL_TABLET | ORAL | 2 refills | Status: DC

## 2019-10-09 NOTE — Telephone Encounter (Signed)
Tried calling,would not complete, will rx clomid and check progesterone level,day 21 July 30, order is in

## 2019-10-09 NOTE — Addendum Note (Signed)
Addended by: Derrek Monaco A on: 10/09/2019 12:57 PM   Modules accepted: Orders

## 2019-10-09 NOTE — Telephone Encounter (Addendum)
Pt wants to start Clomid. Today is day 4 of period. Please advise. Thanks!! [place]

## 2019-10-09 NOTE — Telephone Encounter (Signed)
Needs to talk to [facility] about taking Clomid.

## 2019-10-24 ENCOUNTER — Telehealth: Payer: Self-pay | Admitting: Adult Health

## 2019-10-24 NOTE — Telephone Encounter (Signed)
Patient would like a call back, because she has a few questions about some medicine.

## 2019-10-27 LAB — PROGESTERONE: Progesterone: 21.4 ng/mL

## 2019-11-01 ENCOUNTER — Emergency Department (HOSPITAL_COMMUNITY): Admission: EM | Admit: 2019-11-01 | Discharge: 2019-11-02 | Discharge status: Against Medical Advice | Payer: Self-pay

## 2019-11-01 ENCOUNTER — Other Ambulatory Visit: Payer: Self-pay

## 2019-11-01 NOTE — ED Notes (Signed)
Called for triage no response x 1 

## 2019-11-02 ENCOUNTER — Other Ambulatory Visit: Payer: Self-pay | Admitting: Adult Health

## 2019-11-02 DIAGNOSIS — Z319 Encounter for procreative management, unspecified: Secondary | ICD-10-CM

## 2019-11-02 NOTE — Progress Notes (Signed)
Ck QHCG  

## 2019-11-03 LAB — BETA HCG QUANT (REF LAB): hCG Quant: <1 m[IU]/mL

## 2019-11-05 ENCOUNTER — Other Ambulatory Visit: Payer: Self-pay | Admitting: Adult Health

## 2019-11-05 DIAGNOSIS — Z319 Encounter for procreative management, unspecified: Secondary | ICD-10-CM

## 2019-11-05 NOTE — Progress Notes (Signed)
Ck progesterone 8/30

## 2019-11-10 ENCOUNTER — Other Ambulatory Visit: Payer: Self-pay

## 2019-11-10 ENCOUNTER — Encounter (HOSPITAL_COMMUNITY): Payer: Self-pay | Admitting: Emergency Medicine

## 2019-11-10 ENCOUNTER — Emergency Department (HOSPITAL_COMMUNITY)
Admission: EM | Admit: 2019-11-10 | Discharge: 2019-11-10 | Disposition: A | Discharge status: Home / Self Care | Payer: Self-pay | Attending: Emergency Medicine | Admitting: Emergency Medicine

## 2019-11-10 DIAGNOSIS — R1030 Lower abdominal pain, unspecified: Secondary | ICD-10-CM | POA: Insufficient documentation

## 2019-11-10 DIAGNOSIS — Z5321 Procedure and treatment not carried out due to patient leaving prior to being seen by health care provider: Secondary | ICD-10-CM | POA: Insufficient documentation

## 2019-11-10 NOTE — ED Notes (Signed)
Pt not answering vitals check.

## 2019-11-10 NOTE — ED Triage Notes (Signed)
Pt. Stated, Im having lower abd. Pain for almost 2 weeks. Trying to conceive and Im real bloated.

## 2020-03-05 ENCOUNTER — Ambulatory Visit: Payer: Self-pay | Admitting: Adult Health

## 2020-03-29 NOTE — L&D Delivery Note (Signed)
OB[facility]N [facility] Delivery Note  Sydney Higgins is a 25 y.o. G2P0010 s/p NSVD at [redacted]w[redacted]d She was admitted for PROM.   ROM: 12h 114mith clear fluid GBS Status: Negative Maximum Maternal Temperature: 100  Labor Progress: Patient presented after rupture of membranes. She was started on pitocin and she progressed from 2cm to fully dilated.  Delivery Date/Time: 12/14/2020 at 0311 Delivery: Called to room and patient was complete and pushing. Head delivered LOA. No nuchal cord present. Shoulder and body delivered in usual fashion. Infant with spontaneous cry, placed on mother's abdomen, dried and stimulated. Cord clamped x 2 after 1-minute delay, and cut by patient. Cord blood drawn. Placenta delivered spontaneously with gentle cord traction. Fundus firm with massage and Pitocin. Labia, perineum, vagina, and cervix inspected and found to have bilateral periurethral lacerations that were bleeding and repaired with 3-0 vicryl on SH needle. She also had a first degree laceration that was bleeding and was repaired with a 3-0 vicryl   Placenta: Intact, 3V cord, to L&D Complications: None Lacerations: bilateral periurethral and 1st degree EBL: 25cc Analgesia: IV pain medications for labor and lidocaine for laceration repair   Infant: female  APGARs 8,9  weight pending  AnRenard MatterMD, MPH OB Fellow, [facility] 12/14/2020, 3:59 AM

## 2020-05-05 ENCOUNTER — Encounter: Payer: Self-pay | Admitting: *Deleted

## 2020-05-05 ENCOUNTER — Other Ambulatory Visit: Payer: Self-pay

## 2020-05-05 ENCOUNTER — Ambulatory Visit (INDEPENDENT_AMBULATORY_CARE_PROVIDER_SITE_OTHER): Payer: Self-pay | Admitting: *Deleted

## 2020-05-05 DIAGNOSIS — N926 Irregular menstruation, unspecified: Secondary | ICD-10-CM

## 2020-05-05 LAB — POCT URINE PREGNANCY: Preg Test, Ur: POSITIVE — AB

## 2020-05-05 NOTE — Progress Notes (Signed)
   NURSE VISIT- PREGNANCY CONFIRMATION   SUBJECTIVE:  Maraki Locy is a 25 y.o. G58P0020 female at [redacted]w[redacted]d by certain LMP of Patient's last menstrual period was 03/29/2020. Here for pregnancy confirmation.  Home pregnancy test: positive x 3  She reports no complaints.  She is not taking prenatal vitamins.    OBJECTIVE:  LMP 03/29/2020   Appears well, in no apparent distress OB History  Gravida Para Term Preterm AB Living  3       2 0  SAB IAB Ectopic Multiple Live Births  2            # Outcome Date GA Lbr Len/2nd Weight Sex Delivery Anes PTL Lv  3 Current           2 SAB           1 SAB             Results for orders placed or performed in visit on 05/05/20 (from the past 24 hour(s))  POCT urine pregnancy   Collection Time: 05/05/20 11:09 AM  Result Value Ref Range   Preg Test, Ur Positive (A) Negative    ASSESSMENT: Positive pregnancy test, [redacted]w[redacted]d by LMP    PLAN: Schedule for dating ultrasound in 2 weeks Prenatal vitamins: plans to begin OTC ASAP   Nausea medicines: not currently needed   OB packet given: Yes  Janece Canterbury  05/05/2020 11:09 AM

## 2020-05-05 NOTE — Progress Notes (Signed)
Chart reviewed for nurse visit. Agree with plan of care.  Estill Dooms, NP 05/05/2020 12:34 PM

## 2020-05-09 ENCOUNTER — Other Ambulatory Visit: Payer: Self-pay

## 2020-05-09 ENCOUNTER — Encounter (HOSPITAL_COMMUNITY): Payer: Self-pay | Admitting: *Deleted

## 2020-05-09 ENCOUNTER — Telehealth: Payer: Self-pay | Admitting: *Deleted

## 2020-05-09 ENCOUNTER — Emergency Department (HOSPITAL_COMMUNITY)
Admission: EM | Admit: 2020-05-09 | Discharge: 2020-05-09 | Disposition: A | Discharge status: Home / Self Care | Payer: Medicaid Other | Attending: Emergency Medicine | Admitting: Emergency Medicine

## 2020-05-09 ENCOUNTER — Emergency Department (HOSPITAL_COMMUNITY): Payer: Medicaid Other

## 2020-05-09 DIAGNOSIS — O209 Hemorrhage in early pregnancy, unspecified: Secondary | ICD-10-CM

## 2020-05-09 DIAGNOSIS — Z3A01 Less than 8 weeks gestation of pregnancy: Secondary | ICD-10-CM | POA: Insufficient documentation

## 2020-05-09 DIAGNOSIS — R1031 Right lower quadrant pain: Secondary | ICD-10-CM | POA: Diagnosis not present

## 2020-05-09 DIAGNOSIS — O26891 Other specified pregnancy related conditions, first trimester: Secondary | ICD-10-CM | POA: Diagnosis not present

## 2020-05-09 DIAGNOSIS — Z87891 Personal history of nicotine dependence: Secondary | ICD-10-CM | POA: Diagnosis not present

## 2020-05-09 DIAGNOSIS — R1030 Lower abdominal pain, unspecified: Secondary | ICD-10-CM

## 2020-05-09 LAB — BASIC METABOLIC PANEL WITH GFR
Anion gap: 8 (ref 5–15)
BUN: 6 mg/dL (ref 6–20)
CO2: 22 mmol/L (ref 22–32)
Calcium: 9.3 mg/dL (ref 8.9–10.3)
Chloride: 102 mmol/L (ref 98–111)
Creatinine, Ser: 0.66 mg/dL (ref 0.44–1.00)
GFR, Estimated: >60 mL/min
Glucose, Bld: 100 mg/dL — ABNORMAL HIGH (ref 70–99)
Potassium: 3.4 mmol/L — ABNORMAL LOW (ref 3.5–5.1)
Sodium: 132 mmol/L — ABNORMAL LOW (ref 135–145)

## 2020-05-09 LAB — CBC WITH DIFFERENTIAL/PLATELET
Abs Immature Granulocytes: 0 K/uL (ref 0.00–0.07)
Basophils Absolute: 0.1 K/uL (ref 0.0–0.1)
Basophils Relative: 1 %
Eosinophils Absolute: 0.1 K/uL (ref 0.0–0.5)
Eosinophils Relative: 2 %
HCT: 38.1 % (ref 36.0–46.0)
Hemoglobin: 12.7 g/dL (ref 12.0–15.0)
Immature Granulocytes: 0 %
Lymphocytes Relative: 40 %
Lymphs Abs: 2.4 K/uL (ref 0.7–4.0)
MCH: 28.4 pg (ref 26.0–34.0)
MCHC: 33.3 g/dL (ref 30.0–36.0)
MCV: 85.2 fL (ref 80.0–100.0)
Monocytes Absolute: 0.5 K/uL (ref 0.1–1.0)
Monocytes Relative: 8 %
Neutro Abs: 2.9 K/uL (ref 1.7–7.7)
Neutrophils Relative %: 49 %
Platelets: 211 K/uL (ref 150–400)
RBC: 4.47 MIL/uL (ref 3.87–5.11)
RDW: 12.6 % (ref 11.5–15.5)
WBC: 6 K/uL (ref 4.0–10.5)
nRBC: 0 % (ref 0.0–0.2)

## 2020-05-09 LAB — HCG, QUANTITATIVE, PREGNANCY: hCG, Beta Chain, Quant, S: 37119 m[IU]/mL — ABNORMAL HIGH

## 2020-05-09 NOTE — Telephone Encounter (Signed)
Patient states she is having severe right sided lower abdominal pain.  States at first it was bearable but not it is very painful.  She has not had a dating u/s yet.  Advised patient to go to [facility] for evaluation for ectopic pregnancy.  Pt verbalized understanding and agreeable to plan.

## 2020-05-09 NOTE — ED Provider Notes (Signed)
[facility] Provider Note   CSN: [id number] Arrival date & time: 05/09/20  1635     History Chief Complaint  Patient presents with  . Abdominal Pain    Sydney Higgins is a 25 y.o. female.  Patient complains of right lower quadrant pain with early pregnancy  The history is provided by the patient and medical records. No language interpreter was used.  Abdominal Pain Pain location:  RLQ Pain quality: aching   Pain radiates to:  Does not radiate Pain severity:  Moderate Onset quality:  Sudden Timing:  Constant Progression:  Waxing and waning Chronicity:  New Context: not alcohol use   Relieved by:  Nothing Associated symptoms: no chest pain, no cough, no diarrhea, no fatigue and no hematuria        Past Medical History:  Diagnosis Date  . Anxiety   . 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 DMDD (disruptive mood dysregulation disorder) ([place]) 12/11/2015  . Headache   . Insomnia 12/11/2015  . 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 MDD (major depressive disorder) 05/19/2019  . 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 Encounter for gynecological examination with Papanicolaou smear of cervix 02/14/2019  . Patient desires pregnancy 01/31/2019  . Urine pregnancy test negative 01/31/2019  . Contraceptive education 09/05/2017  . Irregular bleeding 09/05/2017  . 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 Mass of left side of neck   . Depression 01/11/2017  . Breast pain 01/11/2017  . Weight loss 01/11/2017  . Abnormal uterine bleeding (AUB) 01/11/2017  . Screening examination for STD (sexually transmitted disease) 01/11/2017  . Constipation 12/17/2015  . MDD (major depressive disorder), recurrent episode, severe ([place]) 12/11/2015  . 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 DMDD (disruptive mood dysregulation disorder) ([place]) 12/11/2015  . Insomnia  12/11/2015    Past Surgical History:  Procedure Laterality Date  . MASS EXCISION Left 08/26/2017   Procedure: EXCISION SUPERFICIAL MASS FROM LEFT SIDE OF NECK;  Surgeon: Virl Cagey, MD;  Location: [facility];  Service: General;  Laterality: Left;     OB History    Gravida  3   Para      Term      Preterm      AB  2   Living  0     SAB  2   IAB      Ectopic      Multiple      Live Births              Family History  Problem Relation Age of Onset  . Cancer Paternal Grandfather   . Cancer Paternal Grandmother   . Diabetes Maternal Grandmother   . Diabetes Maternal Grandfather   . Hypertension Father   . Bipolar disorder Brother   . Deafness Sister     Social History   Tobacco Use  . Smoking status: Former Smoker    Types: E-cigarettes  . Smokeless tobacco: Never Used  Vaping Use  . Vaping Use: Former  Substance Use Topics  . Alcohol use: Not Currently  . Drug use: Yes    Types: Marijuana    Comment: occ    Home Medications Prior to Admission medications   Medication Sig Start Date End Date Taking? Authorizing Provider  clomiPHENE (CLOMID) 50 MG tablet Take clomid 1 daily for 5 days,  day 3-7 of cycle Patient not taking: Reported on 05/05/2020 10/09/19   Derrek Monaco A, NP  gabapentin (NEURONTIN) 300 MG capsule Take 1 capsule (300 mg total) by mouth 3 (three) times daily. For agitation Patient not taking: Reported on 05/05/2020 05/24/19   Lindell Spar I, NP  prenatal vitamin w/FE, FA (PRENATAL 1 + 1) 27-1 MG TABS tablet Take 1 tablet by mouth daily at 12 noon. Patient not taking: Reported on 05/05/2020 09/25/19   Estill Dooms, NP    Allergies    Apple, Banana, Bee venom, and Pear  Review of Systems   Review of Systems  Constitutional: Negative for appetite change and fatigue.  HENT: Negative for congestion, ear discharge and sinus pressure.   Eyes: Negative for discharge.  Respiratory: Negative for cough.   Cardiovascular: Negative  for chest pain.  Gastrointestinal: Positive for abdominal pain. Negative for diarrhea.  Genitourinary: Negative for frequency and hematuria.  Musculoskeletal: Negative for back pain.  Skin: Negative for rash.  Neurological: Negative for seizures and headaches.  Psychiatric/Behavioral: Negative for hallucinations.    Physical Exam Updated Vital Signs BP 120/88 (BP Location: Right Arm)   Pulse 68   Temp 98.4 F (36.9 C)   Resp 18   Ht 5' (1.524 m)   Wt 43 kg   LMP 03/29/2020   SpO2 100%   BMI 18.51 kg/m   Physical Exam Vitals and nursing note reviewed.  Constitutional:      Appearance: She is well-developed.  HENT:     Head: Normocephalic.     Nose: Nose normal.  Eyes:     General: No scleral icterus.    Extraocular Movements: EOM normal.     Conjunctiva/sclera: Conjunctivae normal.  Neck:     Thyroid: No thyromegaly.  Cardiovascular:     Rate and Rhythm: Normal rate and regular rhythm.     Heart sounds: No murmur heard. No friction rub. No gallop.   Pulmonary:     Breath sounds: No stridor. No wheezing or rales.  Chest:     Chest wall: No tenderness.  Abdominal:     General: There is no distension.     Tenderness: There is abdominal tenderness. There is no rebound.  Musculoskeletal:        General: No edema. Normal range of motion.     Cervical back: Neck supple.  Lymphadenopathy:     Cervical: No cervical adenopathy.  Skin:    Findings: No erythema or rash.  Neurological:     Mental Status: She is alert and oriented to person, place, and time.     Motor: No abnormal muscle tone.     Coordination: Coordination normal.  Psychiatric:        Mood and Affect: Mood and affect normal.        Behavior: Behavior normal.     ED Results / Procedures / Treatments   Labs (all labs ordered are listed, but only abnormal results are displayed) Labs Reviewed  BASIC METABOLIC PANEL - Abnormal; Notable for the following components:      Result Value   Sodium 132 (*)     Potassium 3.4 (*)    Glucose, Bld 100 (*)    All other components within normal limits  HCG, QUANTITATIVE, PREGNANCY - Abnormal; Notable for the following components:   hCG, Beta Chain, Quant, S 37,119 (*)    All other components within normal limits  CBC WITH DIFFERENTIAL/PLATELET    EKG None  Radiology US OB LESS  THAN 14 WEEKS WITH OB TRANSVAGINAL  Result Date: 05/09/2020 CLINICAL DATA:  Right lower quadrant pain x3 days. EXAM: OBSTETRIC <14 WK [place] AND TRANSVAGINAL OB US TECHNIQUE: Both transabdominal and transvaginal ultrasound examinations were performed for complete evaluation of the gestation as well as the maternal uterus, adnexal regions, and pelvic cul-de-sac. Transvaginal technique was performed to assess early pregnancy. COMPARISON:  None. FINDINGS: Intrauterine gestational sac: Single Yolk sac:  Visualized. Embryo:  Visualized. Cardiac Activity: Visualized. Heart Rate: 114 bpm CRL:  54 mm   6 w   2 d                  [place] EDC: December 31, 2020 Subchorionic hemorrhage:  Small Maternal uterus/adnexae: A 1.6 cm x 1.8 cm x 2.0 cm right ovarian cyst is seen. The left ovary is normal in size and appearance. No pelvic free fluid is seen. IMPRESSION: 1. Single, viable intrauterine pregnancy at approximately 6 weeks and 2 days gestation by ultrasound evaluation. 2. Small subchorionic hemorrhage. 3. Right ovarian cyst. Electronically Signed   By: Virgina Norfolk M.D.   On: 05/09/2020 22:50    Procedures Procedures   Medications Ordered in ED Medications - No data to display  ED Course  I have reviewed the triage vital signs and the nursing notes.  Pertinent labs & imaging results that were available during my care of the patient were reviewed by me and considered in my medical decision making (see chart for details).    MDM Rules/Calculators/A&P                          Ultrasound of the pelvis does not show ectopic pregnancy.  She has small amount of subchorionic bleeding.   Patient will be discharged home to follow-up with OB/GYN next week and to take it easy for couple days Final Clinical Impression(s) / ED Diagnoses Final diagnoses:  Lower abdominal pain    Rx / DC Orders ED Discharge Orders    None       Milton Ferguson, MD 05/11/20 1035

## 2020-05-09 NOTE — ED Notes (Signed)
Pt returned from US

## 2020-05-09 NOTE — Discharge Instructions (Addendum)
Rest over the weekend.  Take Tylenol for pain.  Return if severe bleeding or pain.  [facility]

## 2020-05-09 NOTE — ED Triage Notes (Signed)
Right lower quadrant pain, states she is pregnant, concerned she may have an ectopic pregnancy

## 2020-05-09 NOTE — ED Notes (Signed)
Pt to US.

## 2020-05-12 ENCOUNTER — Telehealth: Payer: Self-pay | Admitting: Obstetrics & Gynecology

## 2020-05-12 NOTE — Telephone Encounter (Signed)
Pt seen at [facility] ED 05/09/2020 - discharge recommended follow up here this week, we do not have anything this week (we have limited availability today with [facility])  Please review ED note & advise on when to schedule & with which provider

## 2020-05-15 ENCOUNTER — Encounter (HOSPITAL_COMMUNITY): Payer: Self-pay | Admitting: *Deleted

## 2020-05-15 ENCOUNTER — Other Ambulatory Visit: Payer: Self-pay

## 2020-05-15 ENCOUNTER — Emergency Department (HOSPITAL_COMMUNITY)
Admission: EM | Admit: 2020-05-15 | Discharge: 2020-05-15 | Disposition: A | Discharge status: Home / Self Care | Payer: Medicaid Other | Attending: Emergency Medicine | Admitting: Emergency Medicine

## 2020-05-15 ENCOUNTER — Telehealth: Payer: Self-pay | Admitting: Women's Health

## 2020-05-15 DIAGNOSIS — Z87891 Personal history of nicotine dependence: Secondary | ICD-10-CM | POA: Diagnosis not present

## 2020-05-15 DIAGNOSIS — O210 Mild hyperemesis gravidarum: Secondary | ICD-10-CM | POA: Diagnosis not present

## 2020-05-15 DIAGNOSIS — Z3A00 Weeks of gestation of pregnancy not specified: Secondary | ICD-10-CM | POA: Insufficient documentation

## 2020-05-15 DIAGNOSIS — R1110 Vomiting, unspecified: Secondary | ICD-10-CM

## 2020-05-15 LAB — URINALYSIS, ROUTINE W REFLEX MICROSCOPIC
Bilirubin Urine: NEGATIVE
Glucose, UA: NEGATIVE mg/dL
Hgb urine dipstick: NEGATIVE
Ketones, ur: 80 mg/dL — AB
Nitrite: NEGATIVE
Protein, ur: 30 mg/dL — AB
Specific Gravity, Urine: 1.014 (ref 1.005–1.030)
pH: 5 (ref 5.0–8.0)

## 2020-05-15 LAB — CBC
HCT: 37.3 % (ref 36.0–46.0)
Hemoglobin: 12.6 g/dL (ref 12.0–15.0)
MCH: 28.4 pg (ref 26.0–34.0)
MCHC: 33.8 g/dL (ref 30.0–36.0)
MCV: 84.2 fL (ref 80.0–100.0)
Platelets: 231 10*3/uL (ref 150–400)
RBC: 4.43 MIL/uL (ref 3.87–5.11)
RDW: 12 % (ref 11.5–15.5)
WBC: 8.2 10*3/uL (ref 4.0–10.5)
nRBC: 0 % (ref 0.0–0.2)

## 2020-05-15 LAB — COMPREHENSIVE METABOLIC PANEL
ALT: 13 U/L (ref 0–44)
AST: 15 U/L (ref 15–41)
Albumin: 4.1 g/dL (ref 3.5–5.0)
Alkaline Phosphatase: 40 U/L (ref 38–126)
Anion gap: 9 (ref 5–15)
BUN: 5 mg/dL — ABNORMAL LOW (ref 6–20)
CO2: 20 mmol/L — ABNORMAL LOW (ref 22–32)
Calcium: 9.4 mg/dL (ref 8.9–10.3)
Chloride: 102 mmol/L (ref 98–111)
Creatinine, Ser: 0.67 mg/dL (ref 0.44–1.00)
GFR, Estimated: >60 mL/min (ref >60)
Glucose, Bld: 81 mg/dL (ref 70–99)
Potassium: 3.2 mmol/L — ABNORMAL LOW (ref 3.5–5.1)
Sodium: 131 mmol/L — ABNORMAL LOW (ref 135–145)
Total Bilirubin: 0.7 mg/dL (ref 0.3–1.2)
Total Protein: 7.1 g/dL (ref 6.5–8.1)

## 2020-05-15 MED ORDER — COMPLETENATE 29-1 MG PO CHEW: 1.0000 | CHEWABLE_TABLET | Freq: Every day | ORAL | Status: DC

## 2020-05-15 MED ORDER — SODIUM CHLORIDE 0.9 % IV BOLUS
1000.0000 mL | Freq: Once | INTRAVENOUS | Status: AC
  Administered 2020-05-15: 1000 mL via INTRAVENOUS

## 2020-05-15 MED ORDER — METOCLOPRAMIDE HCL 10 MG PO TABS: 10.0000 mg | ORAL_TABLET | Freq: Three times a day (TID) | ORAL | 0 refills | Status: DC

## 2020-05-15 MED ORDER — PRENATAL MULTIVITAMIN CH
1.0000 | ORAL_TABLET | Freq: Every day | ORAL | Status: DC
  Filled 2020-05-15: qty 1

## 2020-05-15 MED ORDER — METOCLOPRAMIDE HCL 5 MG/ML IJ SOLN
10.0000 mg | Freq: Once | INTRAMUSCULAR | Status: AC
  Administered 2020-05-15: 10 mg via INTRAVENOUS
  Filled 2020-05-15: qty 2

## 2020-05-15 MED ORDER — SODIUM CHLORIDE 0.9 % IV SOLN
1.0000 g | Freq: Once | INTRAVENOUS | Status: AC
  Administered 2020-05-15: 1 g via INTRAVENOUS
  Filled 2020-05-15: qty 10

## 2020-05-15 MED ORDER — COMPLETENATE 29-1 MG PO CHEW
1.0000 | CHEWABLE_TABLET | Freq: Every day | ORAL | Status: DC
  Filled 2020-05-15: qty 1

## 2020-05-15 MED ORDER — LACTATED RINGERS IV BOLUS
1000.0000 mL | Freq: Once | INTRAVENOUS | Status: AC
  Administered 2020-05-15: 1000 mL via INTRAVENOUS

## 2020-05-15 MED ORDER — POTASSIUM CHLORIDE 20 MEQ PO PACK
20.0000 meq | PACK | Freq: Once | ORAL | Status: AC
  Administered 2020-05-15: 20 meq via ORAL
  Filled 2020-05-15: qty 1

## 2020-05-15 NOTE — ED Triage Notes (Signed)
Pregnant, sent over from [facility] for evaluation due to vomiting

## 2020-05-15 NOTE — Discharge Instructions (Signed)
Follow up with your OBgyn for evaluation

## 2020-05-15 NOTE — ED Provider Notes (Signed)
[facility] EMERGENCY DEPARTMENT Provider Note   CSN: [id number] Arrival date & time: 05/15/20  1629     History Chief Complaint  Patient presents with  . Emesis During Pregnancy    Sydney Higgins is a 25 y.o. female.  The history is provided by the patient. No language interpreter was used.  Emesis Severity:  Moderate Timing:  Constant Progression:  Worsening Chronicity:  New Relieved by:  Nothing Worsened by:  Nothing Ineffective treatments:  None tried Risk factors: pregnant        Past Medical History:  Diagnosis Date  . Anxiety   . 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 DMDD (disruptive mood dysregulation disorder) ([place]) 12/11/2015  . Headache   . Insomnia 12/11/2015  . 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 MDD (major depressive disorder) 05/19/2019  . 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 Encounter for gynecological examination with Papanicolaou smear of cervix 02/14/2019  . Patient desires pregnancy 01/31/2019  . Urine pregnancy test negative 01/31/2019  . Contraceptive education 09/05/2017  . Irregular bleeding 09/05/2017  . 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 Mass of left side of neck   . Depression 01/11/2017  . Breast pain 01/11/2017  . Weight loss 01/11/2017  . Abnormal uterine bleeding (AUB) 01/11/2017  . Screening examination for STD (sexually transmitted disease) 01/11/2017  . Constipation 12/17/2015  . MDD (major depressive disorder), recurrent episode, severe ([place]) 12/11/2015  . 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 DMDD (disruptive mood dysregulation disorder) ([place]) 12/11/2015  . Insomnia 12/11/2015    Past Surgical History:  Procedure Laterality Date  . MASS EXCISION Left 08/26/2017   Procedure: EXCISION SUPERFICIAL MASS FROM LEFT SIDE OF NECK;  Surgeon: Virl Cagey, MD;  Location: [facility];  Service: General;   Laterality: Left;     OB History    Gravida  3   Para      Term      Preterm      AB  2   Living  0     SAB  2   IAB      Ectopic      Multiple      Live Births              Family History  Problem Relation Age of Onset  . Cancer Paternal Grandfather   . Cancer Paternal Grandmother   . Diabetes Maternal Grandmother   . Diabetes Maternal Grandfather   . Hypertension Father   . Bipolar disorder Brother   . Deafness Sister     Social History   Tobacco Use  . Smoking status: Former Smoker    Types: E-cigarettes  . Smokeless tobacco: Never Used  Vaping Use  . Vaping Use: Former  Substance Use Topics  . Alcohol use: Not Currently  . Drug use: Yes    Types: Marijuana    Comment: occ    Home Medications Prior to Admission medications   Medication Sig Start Date End Date Taking? Authorizing Provider  metoCLOPramide (REGLAN) 10 MG tablet Take 1 tablet (10 mg total) by mouth 3 (three) times daily with meals. 05/15/20 05/15/21 Yes Fransico Meadow, PA-C  clomiPHENE (CLOMID) 50 MG tablet Take clomid 1 daily for 5 days, day 3-7 of cycle Patient not taking: Reported on 05/05/2020 10/09/19   Estill Dooms, NP  gabapentin (NEURONTIN) 300 MG capsule Take 1 capsule (300 mg total) by mouth 3 (three) times daily. For agitation Patient not taking: Reported on 05/05/2020 05/24/19   Lindell Spar I, NP  prenatal vitamin w/FE, FA (PRENATAL 1 + 1) 27-1 MG TABS tablet Take 1 tablet by mouth daily at 12 noon. Patient not taking: Reported on 05/05/2020 09/25/19   Estill Dooms, NP    Allergies    Apple, Banana, Bee venom, and Pear  Review of Systems   Review of Systems  Gastrointestinal: Positive for vomiting.  All other systems reviewed and are negative.   Physical Exam Updated Vital Signs BP 125/81   Pulse 64   Temp 98.4 F (36.9 C) (Oral)   Resp 20   Wt 44 kg   LMP 03/29/2020   SpO2 100%   BMI 18.94 kg/m   Physical Exam Vitals and nursing note  reviewed.  Constitutional:      Appearance: She is well-developed and well-nourished.  HENT:     Head: Normocephalic.     Mouth/Throat:     Mouth: Mucous membranes are moist.  Eyes:     Extraocular Movements: EOM normal.  Cardiovascular:     Rate and Rhythm: Normal rate.  Pulmonary:     Effort: Pulmonary effort is normal.  Abdominal:     General: There is no distension.  Musculoskeletal:        General: Normal range of motion.     Cervical back: Normal range of motion.  Skin:    General: Skin is warm.  Neurological:     General: No focal deficit present.     Mental Status: She is alert and oriented to person, place, and time.  Psychiatric:        Mood and Affect: Mood and affect and mood normal.     ED Results / Procedures / Treatments   Labs (all labs ordered are listed, but only abnormal results are displayed) Labs Reviewed  COMPREHENSIVE METABOLIC PANEL - Abnormal; Notable for the following components:      Result Value   Sodium 131 (*)    Potassium 3.2 (*)    CO2 20 (*)    BUN 5 (*)    All other components within normal limits  URINALYSIS, ROUTINE W REFLEX MICROSCOPIC - Abnormal; Notable for the following components:   APPearance CLOUDY (*)    Ketones, ur 80 (*)    Protein, ur 30 (*)    Leukocytes,Ua LARGE (*)    Bacteria, UA RARE (*)    All other components within normal limits  CBC    EKG None  Radiology No results found.  Procedures Procedures   Medications Ordered in ED Medications  potassium chloride (KLOR-CON) packet 20 mEq (has no administration in time range)  prenatal multivitamin tablet 1 tablet (has no administration in time range)  metoCLOPramide (REGLAN) injection 10 mg (10 mg Intravenous Given 05/15/20 1939)  sodium chloride 0.9 % bolus 1,000 mL (0 mLs Intravenous Stopped 05/15/20 2035)  cefTRIAXone (ROCEPHIN) 1 g in sodium chloride 0.9 % 100 mL IVPB (0 g Intravenous Stopped 05/15/20 2035)  lactated ringers bolus 1,000 mL (0 mLs  Intravenous Stopped 05/15/20 2139)    ED Course  I have reviewed the triage vital signs and the nursing notes.  Pertinent labs & imaging results that were available during my care of the patient were reviewed by me and considered in my medical decision making (see chart for details).    MDM Rules/Calculators/A&P  Pt given iv fluids x 2 liters, Reglan IV.  Pt tolerated oral fluids.  Pt given multivitamin and potassium  Final Clinical Impression(s) / ED Diagnoses Final diagnoses:  Hyperemesis    Rx / DC Orders ED Discharge Orders         Ordered    metoCLOPramide (REGLAN) 10 MG tablet  3 times daily with meals        05/15/20 2210        An After Visit Summary was printed and given to the patient.    Sidney Ace 05/15/20 2213    Fredia Sorrow, MD 05/27/20 [phone]

## 2020-05-15 NOTE — Telephone Encounter (Signed)
Returned pt's call to get clarification on symptoms. Pt c/o severe nausea & vomiting and unable to eat. She ate a few crackers and chips 2 days ago, a couple of bites of food yesterday, and nothing today, not even water. No bleeding, some abdominal cramping. Pt stated that she was still urinating and sometimes it was dark. Pt stated that her mouth and lips were extremely dry. Instructed pt to go to [facility] for IV fluids due to dehydration. Pt confirms understanding and has her mother to drive her.

## 2020-05-15 NOTE — Telephone Encounter (Signed)
Patient's mom called stating that her daughter is early in her pregnancy and has not been able to hold anything down. Patient's mom states that her daughter can not even hold water. Patient's mom states if we could give her a call back because she would have a better understanding of what she can do to help her. Please contact either patient's mom Charlene Enderson at [phone] or patient. She is staying at [employer] house right now.

## 2020-05-21 ENCOUNTER — Other Ambulatory Visit: Payer: Self-pay

## 2020-06-17 ENCOUNTER — Other Ambulatory Visit: Payer: Self-pay | Admitting: Obstetrics & Gynecology

## 2020-06-17 DIAGNOSIS — Z3682 Encounter for antenatal screening for nuchal translucency: Secondary | ICD-10-CM

## 2020-06-18 ENCOUNTER — Encounter: Payer: Self-pay | Admitting: Advanced Practice Midwife

## 2020-06-18 ENCOUNTER — Ambulatory Visit (INDEPENDENT_AMBULATORY_CARE_PROVIDER_SITE_OTHER): Payer: Medicaid Other | Admitting: Advanced Practice Midwife

## 2020-06-18 ENCOUNTER — Ambulatory Visit: Payer: Medicaid Other | Admitting: *Deleted

## 2020-06-18 ENCOUNTER — Ambulatory Visit (INDEPENDENT_AMBULATORY_CARE_PROVIDER_SITE_OTHER): Payer: Medicaid Other

## 2020-06-18 ENCOUNTER — Other Ambulatory Visit: Payer: Self-pay

## 2020-06-18 VITALS — BP 111/78 | HR 69 | Wt 103.0 lb

## 2020-06-18 DIAGNOSIS — O0990 Supervision of high risk pregnancy, unspecified, unspecified trimester: Secondary | ICD-10-CM | POA: Insufficient documentation

## 2020-06-18 DIAGNOSIS — Z3480 Encounter for supervision of other normal pregnancy, unspecified trimester: Secondary | ICD-10-CM | POA: Diagnosis not present

## 2020-06-18 DIAGNOSIS — Z3A11 11 weeks gestation of pregnancy: Secondary | ICD-10-CM | POA: Diagnosis not present

## 2020-06-18 DIAGNOSIS — R87612 Low grade squamous intraepithelial lesion on cytologic smear of cervix (LGSIL): Secondary | ICD-10-CM | POA: Diagnosis not present

## 2020-06-18 DIAGNOSIS — O99341 Other mental disorders complicating pregnancy, first trimester: Secondary | ICD-10-CM

## 2020-06-18 DIAGNOSIS — Z720 Tobacco use: Secondary | ICD-10-CM | POA: Insufficient documentation

## 2020-06-18 DIAGNOSIS — Z3400 Encounter for supervision of normal first pregnancy, unspecified trimester: Secondary | ICD-10-CM

## 2020-06-18 DIAGNOSIS — F32A Depression, unspecified: Secondary | ICD-10-CM

## 2020-06-18 DIAGNOSIS — Z3682 Encounter for antenatal screening for nuchal translucency: Secondary | ICD-10-CM

## 2020-06-18 DIAGNOSIS — Z3490 Encounter for supervision of normal pregnancy, unspecified, unspecified trimester: Secondary | ICD-10-CM | POA: Insufficient documentation

## 2020-06-18 DIAGNOSIS — F339 Major depressive disorder, recurrent, unspecified: Secondary | ICD-10-CM

## 2020-06-18 DIAGNOSIS — F1290 Cannabis use, unspecified, uncomplicated: Secondary | ICD-10-CM

## 2020-06-18 LAB — POCT URINALYSIS DIPSTICK OB
Blood, UA: NEGATIVE
Glucose, UA: NEGATIVE
Ketones, UA: NEGATIVE
Leukocytes, UA: NEGATIVE
Nitrite, UA: NEGATIVE
POC,PROTEIN,UA: NEGATIVE

## 2020-06-18 MED ORDER — BUSPIRONE HCL 5 MG PO TABS: 5.0000 mg | ORAL_TABLET | Freq: Three times a day (TID) | ORAL | 3 refills | Status: DC

## 2020-06-18 NOTE — Progress Notes (Signed)
INITIAL OBSTETRICAL VISIT Patient name: Sydney Higgins MRN [id number]  Date of birth: 02-09-1996 Chief Complaint:   Initial Prenatal Visit (Nt/it)  History of Present Illness:   Sydney Higgins is a 25 y.o. G71P0010 African American female at [redacted]w[redacted]d by LMP c/w u/s at 6.2 weeks with an Estimated Date of Delivery: 01/03/21 being seen today for her initial obstetrical visit.   Her obstetrical history is significant for using THC and vaping- has been cutting down since finding out about pregnancy.   Today she reports feeling more depressed since stopping Gabapentin with +preg test; requests medication and referral to [facility].  Depression screen [facility] 2/9 06/18/2020 02/14/2019 01/31/2019 01/11/2017  Decreased Interest 2 2 2 2   Down, Depressed, Hopeless 2 1 1 3   PHQ - 2 Score 4 3 3 5   Altered sleeping 0 1 2 3   Tired, decreased energy 3 1 2 2   Change in appetite 3 2 1 3   Feeling bad or failure about yourself  2 1 2 2   Trouble concentrating 1 3 2 2   Moving slowly or fidgety/restless 0 1 1 2   Suicidal thoughts 1 1 1 1   PHQ-9 Score 14 13 14 20   Difficult doing work/chores - - Very difficult -    Patient's last menstrual period was 03/29/2020. Last pap Nov 2020. Results were: LSIL w/ HRHPV not done Review of Systems:   Pertinent items are noted in HPI Denies cramping/contractions, leakage of fluid, vaginal bleeding, abnormal vaginal discharge w/ itching/odor/irritation, headaches, visual changes, shortness of breath, chest pain, abdominal pain, severe nausea/vomiting, or problems with urination or bowel movements unless otherwise stated above.  Pertinent History Reviewed:  Reviewed past medical,surgical, social, obstetrical and family history.  Reviewed problem list, medications and allergies. OB History  Gravida Para Term Preterm AB Living  2       1 0  SAB IAB Ectopic Multiple Live Births  1            # Outcome Date GA Lbr Len/2nd Weight Sex Delivery Anes PTL Lv  2 Current           1 SAB 2021            Physical Assessment:   Vitals:   06/18/20 0949  BP: 111/78  Pulse: 69  Weight: 103 lb (46.7 kg)  Body mass index is 20.12 kg/m.       Physical Examination:  General appearance - well appearing, and in no distress  Mental status - alert, oriented to person, place, and time  Psych:  She has a normal mood and affect  Skin - warm and dry, normal color, no suspicious lesions noted  Chest - effort normal, all lung fields clear to auscultation bilaterally  Heart - normal rate and regular rhythm  Abdomen - soft, nontender  Extremities:  No swelling or varicosities noted  Pelvic - not indicated  Thin prep pap is not done       TODAY'S NT [place] 12 wks,measurements c/w dates,crl 53.76 mm,normal ovaries,NB present,NT 1.1 mm,fhr 161 bpm,anterior placenta   Results for orders placed or performed in visit on 06/18/20 (from the past 24 hour(s))  POC Urinalysis Dipstick OB   Collection Time: 06/18/20 11:03 AM  Result Value Ref Range   Color, UA     Clarity, UA     Glucose, UA Negative Negative   Bilirubin, UA     Ketones, UA neg    Spec Grav, UA     Blood, UA neg  pH, UA     POC,PROTEIN,UA Negative Negative, Trace, Small (1+), Moderate (2+), Large (3+), 4+   Urobilinogen, UA     Nitrite, UA neg    Leukocytes, UA Negative Negative   Appearance     Odor      Assessment & Plan:  1) Low-Risk Pregnancy G2P0010 at [redacted]w[redacted]d with an Estimated Date of Delivery: 01/03/21   2) Initial OB visit  3) Vaping, is cutting down; rec to stop  4) THC use, is cutting down; rec to stop; uses to keep her appetite up  5) Depression/anxiety, was on Gabapentin prior to preg; rec an SSRI but pt declines due to family member with hx of suicide; will try Buspar and refer to [facility] per her request  6) Pap with LSIL, in November 2020>needs repeat  Meds:  Meds ordered this encounter  Medications  . busPIRone (BUSPAR) 5 MG tablet    Sig: Take 1 tablet (5 mg total) by mouth 3 (three) times daily.     Dispense:  90 tablet    Refill:  3    Order Specific Question:   Supervising Provider    Answer:   Tania Ade H [[id number]]    Initial labs obtained Continue prenatal vitamins Reviewed n/v relief measures and warning s/s to report Reviewed recommended weight gain based on pre-gravid BMI Encouraged well-balanced diet Genetic & carrier screening discussed: requests Panorama and NT/IT, requests Horizon 14  Ultrasound discussed; fetal survey: requested [place] completed> form faxed if has or is planning to apply for medicaid The nature of [place] for [employer] with multiple MDs and other Advanced Practice Providers was explained to patient; also emphasized that fellows, residents, and students are part of our team. Given home bp cuff. Check bp weekly, let us know if >140/90.   No indications for ASA therapy or early HgbA1c (per uptodate)  Follow-up: Return in about 4 weeks (around 07/16/2020) for LROB, in person.   Orders Placed This Encounter  Procedures  . Urine Culture  . GC/Chlamydia Probe Amp  . Integrated 1  . Genetic Screening  . Pain Management Screening Profile (10S)  . CBC/D/Plt+RPR+Rh+ABO+Rub Ab...  . Ambulatory referral to [place]  . POC Urinalysis Dipstick OB    Myrtis Ser CNM 06/18/2020 1:10 PM

## 2020-06-18 NOTE — Patient Instructions (Signed)
Sydney Higgins, I greatly value your feedback.  If you receive a survey following your visit with [place] today, we appreciate you taking the time to fill it out.  Thanks, Derrill Memo CNM   [place] at [facility] ([place], [place] [address]) Entrance C, located off of [address] parking   Nausea & Vomiting  Have saltine crackers or pretzels by your bed and eat a few bites before you raise your head out of bed in the morning  Eat small frequent meals throughout the day instead of large meals  Drink plenty of fluids throughout the day to stay hydrated, just don't drink a lot of fluids with your meals.  This can make your stomach fill up faster making you feel sick  Do not brush your teeth right after you eat  Products with real ginger are good for nausea, like ginger ale and ginger hard candy Make sure it says made with real ginger!  Sucking on sour candy like lemon heads is also good for nausea  If your prenatal vitamins make you nauseated, take them at night so you will sleep through the nausea  Sea Bands  If you feel like you need medicine for the nausea & vomiting please let us know  If you are unable to keep any fluids or food down please let us know   Constipation  Drink plenty of fluid, preferably water, throughout the day  Eat foods high in fiber such as fruits, vegetables, and grains  Exercise, such as walking, is a good way to keep your bowels regular  Drink warm fluids, especially warm prune juice, or decaf coffee  Eat a 1/2 cup of real oatmeal (not instant), 1/2 cup applesauce, and 1/2-1 cup warm prune juice every day  If needed, you may take Colace (docusate sodium) stool softener once or twice a day to help keep the stool soft.   If you still are having problems with constipation, you may take Miralax once daily as needed to help keep your bowels regular.   Home Blood Pressure Monitoring for Patients   Your provider  has recommended that you check your blood pressure (BP) at least once a week at home. If you do not have a blood pressure cuff at home, one will be provided for you. Contact your provider if you have not received your monitor within 1 week.   Helpful Tips for Accurate Home Blood Pressure Checks  . Don't smoke, exercise, or drink caffeine 30 minutes before checking your BP . Use the restroom before checking your BP (a full bladder can raise your pressure) . Relax in a comfortable upright chair . Feet on the ground . Left arm resting comfortably on a flat surface at the level of your heart . Legs uncrossed . Back supported . Sit quietly and don't talk . Place the cuff on your bare arm . Adjust snuggly, so that only two fingertips can fit between your skin and the top of the cuff . Check 2 readings separated by at least one minute . Keep a log of your BP readings . For a visual, please reference this diagram: http://ccnc.care/bpdiagram  Provider Name: [facility]     Phone: [phone]  Zone 1: ALL CLEAR  Continue to monitor your symptoms:  . BP reading is less than 140 (top number) or less than 90 (bottom number)  . No right upper stomach pain . No headaches or seeing spots .  No feeling nauseated or throwing up . No swelling in face and hands  Zone 2: CAUTION Call your doctor's office for any of the following:  . BP reading is greater than 140 (top number) or greater than 90 (bottom number)  . Stomach pain under your ribs in the middle or right side . Headaches or seeing spots . Feeling nauseated or throwing up . Swelling in face and hands  Zone 3: EMERGENCY  Seek immediate medical care if you have any of the following:  . BP reading is greater than160 (top number) or greater than 110 (bottom number) . Severe headaches not improving with Tylenol . Serious difficulty catching your breath . Any worsening symptoms from Zone 2    First Trimester of Pregnancy The first  trimester of pregnancy is from week 1 until the end of week 12 (months 1 through 3). A week after a sperm fertilizes an egg, the egg will implant on the wall of the uterus. This embryo will begin to develop into a baby. Genes from you and your partner are forming the baby. The female genes determine whether the baby is a boy or a girl. At 6-8 weeks, the eyes and face are formed, and the heartbeat can be seen on ultrasound. At the end of 12 weeks, all the baby's organs are formed.  Now that you are pregnant, you will want to do everything you can to have a healthy baby. Two of the most important things are to get good prenatal care and to follow your health care provider's instructions. Prenatal care is all the medical care you receive before the baby's birth. This care will help prevent, find, and treat any problems during the pregnancy and childbirth. BODY CHANGES Your body goes through many changes during pregnancy. The changes vary from woman to woman.   You may gain or lose a couple of pounds at first.  You may feel sick to your stomach (nauseous) and throw up (vomit). If the vomiting is uncontrollable, call your health care provider.  You may tire easily.  You may develop headaches that can be relieved by medicines approved by your health care provider.  You may urinate more often. Painful urination may mean you have a bladder infection.  You may develop heartburn as a result of your pregnancy.  You may develop constipation because certain hormones are causing the muscles that push waste through your intestines to slow down.  You may develop hemorrhoids or swollen, bulging veins (varicose veins).  Your breasts may begin to grow larger and become tender. Your nipples may stick out more, and the tissue that surrounds them (areola) may become darker.  Your gums may bleed and may be sensitive to brushing and flossing.  Dark spots or blotches (chloasma, mask of pregnancy) may develop on your  face. This will likely fade after the baby is born.  Your menstrual periods will stop.  You may have a loss of appetite.  You may develop cravings for certain kinds of food.  You may have changes in your emotions from day to day, such as being excited to be pregnant or being concerned that something may go wrong with the pregnancy and baby.  You may have more vivid and strange dreams.  You may have changes in your hair. These can include thickening of your hair, rapid growth, and changes in texture. Some women also have hair loss during or after pregnancy, or hair that feels dry or thin. Your hair will most  likely return to normal after your baby is born. WHAT TO EXPECT AT YOUR PRENATAL VISITS During a routine prenatal visit:  You will be weighed to make sure you and the baby are growing normally.  Your blood pressure will be taken.  Your abdomen will be measured to track your baby's growth.  The fetal heartbeat will be listened to starting around week 10 or 12 of your pregnancy.  Test results from any previous visits will be discussed. Your health care provider may ask you:  How you are feeling.  If you are feeling the baby move.  If you have had any abnormal symptoms, such as leaking fluid, bleeding, severe headaches, or abdominal cramping.  If you have any questions. Other tests that may be performed during your first trimester include:  Blood tests to find your blood type and to check for the presence of any previous infections. They will also be used to check for low iron levels (anemia) and Rh antibodies. Later in the pregnancy, blood tests for diabetes will be done along with other tests if problems develop.  Urine tests to check for infections, diabetes, or protein in the urine.  An ultrasound to confirm the proper growth and development of the baby.  An amniocentesis to check for possible genetic problems.  Fetal screens for spina bifida and Down syndrome.  You  may need other tests to make sure you and the baby are doing well. HOME CARE INSTRUCTIONS  Medicines  Follow your health care provider's instructions regarding medicine use. Specific medicines may be either safe or unsafe to take during pregnancy.  Take your prenatal vitamins as directed.  If you develop constipation, try taking a stool softener if your health care provider approves. Diet  Eat regular, well-balanced meals. Choose a variety of foods, such as meat or vegetable-based protein, fish, milk and low-fat dairy products, vegetables, fruits, and whole grain breads and cereals. Your health care provider will help you determine the amount of weight gain that is right for you.  Avoid raw meat and uncooked cheese. These carry germs that can cause birth defects in the baby.  Eating four or five small meals rather than three large meals a day may help relieve nausea and vomiting. If you start to feel nauseous, eating a few soda crackers can be helpful. Drinking liquids between meals instead of during meals also seems to help nausea and vomiting.  If you develop constipation, eat more high-fiber foods, such as fresh vegetables or fruit and whole grains. Drink enough fluids to keep your urine clear or pale yellow. Activity and Exercise  Exercise only as directed by your health care provider. Exercising will help you:  Control your weight.  Stay in shape.  Be prepared for labor and delivery.  Experiencing pain or cramping in the lower abdomen or low back is a good sign that you should stop exercising. Check with your health care provider before continuing normal exercises.  Try to avoid standing for long periods of time. Move your legs often if you must stand in one place for a long time.  Avoid heavy lifting.  Wear low-heeled shoes, and practice good posture.  You may continue to have sex unless your health care provider directs you otherwise. Relief of Pain or Discomfort  Wear a  good support bra for breast tenderness.    Take warm sitz baths to soothe any pain or discomfort caused by hemorrhoids. Use hemorrhoid cream if your health care provider approves.  Rest with your legs elevated if you have leg cramps or low back pain.  If you develop varicose veins in your legs, wear support hose. Elevate your feet for 15 minutes, 3-4 times a day. Limit salt in your diet. Prenatal Care  Schedule your prenatal visits by the twelfth week of pregnancy. They are usually scheduled monthly at first, then more often in the last 2 months before delivery.  Write down your questions. Take them to your prenatal visits.  Keep all your prenatal visits as directed by your health care provider. Safety  Wear your seat belt at all times when driving.  Make a list of emergency phone numbers, including numbers for family, friends, the hospital, and police and fire departments. General Tips  Ask your health care provider for a referral to a local prenatal education class. Begin classes no later than at the beginning of month 6 of your pregnancy.  Ask for help if you have counseling or nutritional needs during pregnancy. Your health care provider can offer advice or refer you to specialists for help with various needs.  Do not use hot tubs, steam rooms, or saunas.  Do not douche or use tampons or scented sanitary pads.  Do not cross your legs for long periods of time.  Avoid cat litter boxes and soil used by cats. These carry germs that can cause birth defects in the baby and possibly loss of the fetus by miscarriage or stillbirth.  Avoid all smoking, herbs, alcohol, and medicines not prescribed by your health care provider. Chemicals in these affect the formation and growth of the baby.  Schedule a dentist appointment. At home, brush your teeth with a soft toothbrush and be gentle when you floss. SEEK MEDICAL CARE IF:   You have dizziness.  You have mild pelvic cramps, pelvic  pressure, or nagging pain in the abdominal area.  You have persistent nausea, vomiting, or diarrhea.  You have a bad smelling vaginal discharge.  You have pain with urination.  You notice increased swelling in your face, hands, legs, or ankles. SEEK IMMEDIATE MEDICAL CARE IF:   You have a fever.  You are leaking fluid from your vagina.  You have spotting or bleeding from your vagina.  You have severe abdominal cramping or pain.  You have rapid weight gain or loss.  You vomit blood or material that looks like coffee grounds.  You are exposed to [place] measles and have never had them.  You are exposed to fifth disease or chickenpox.  You develop a severe headache.  You have shortness of breath.  You have any kind of trauma, such as from a fall or a car accident. Document Released: 03/09/2001 Document Revised: 07/30/2013 Document Reviewed: 01/23/2013 [facility] Patient Information 2015 [place], [place]. This information is not intended to replace advice given to you by your health care provider. Make sure you discuss any questions you have with your health care provider.

## 2020-06-18 NOTE — Progress Notes (Signed)
[place] 12 wks,measurements c/w dates,crl 53.76 mm,normal ovaries,NB present,NT 1.1 mm,fhr 161 bpm,anterior placenta

## 2020-06-19 LAB — PMP SCREEN PROFILE (10S), URINE
Amphetamine Scrn, Ur: NEGATIVE ng/mL
BARBITURATE SCREEN URINE: NEGATIVE ng/mL
BENZODIAZEPINE SCREEN, URINE: NEGATIVE ng/mL
CANNABINOIDS UR QL SCN: POSITIVE ng/mL — AB
Cocaine (Metab) Scrn, Ur: NEGATIVE ng/mL
Creatinine(Crt), U: 39.1 mg/dL (ref 20.0–300.0)
Methadone Screen, Urine: NEGATIVE ng/mL
OXYCODONE+OXYMORPHONE UR QL SCN: NEGATIVE ng/mL
Opiate Scrn, Ur: NEGATIVE ng/mL
Ph of Urine: 7.1 (ref 4.5–8.9)
Phencyclidine Qn, Ur: NEGATIVE ng/mL
Propoxyphene Scrn, Ur: NEGATIVE ng/mL

## 2020-06-19 LAB — GC/CHLAMYDIA PROBE AMP
Chlamydia trachomatis, NAA: NEGATIVE
Neisseria Gonorrhoeae by PCR: NEGATIVE

## 2020-06-19 LAB — MED LIST OPTION NOT SELECTED

## 2020-06-20 LAB — INTEGRATED 1
Crown Rump Length: 53.8 mm
Gest. Age on Collection Date: 11.9 weeks
Maternal Age at EDD: 23.2 yr
Nuchal Translucency (NT): 1.1 mm
Number of Fetuses: 1
PAPP-A Value: 2391.2 ng/mL
Weight: 103 [lb_av]

## 2020-06-20 LAB — CBC/D/PLT+RPR+RH+ABO+RUB AB...
Antibody Screen: NEGATIVE
Basophils Absolute: 0.1 10*3/uL (ref 0.0–0.2)
Basos: 1 %
EOS (ABSOLUTE): 0.3 10*3/uL (ref 0.0–0.4)
Eos: 4 %
HCV Ab: <0.1 s/co ratio (ref 0.0–0.9)
HIV Screen 4th Generation wRfx: NONREACTIVE
Hematocrit: 35 % (ref 34.0–46.6)
Hemoglobin: 11.5 g/dL (ref 11.1–15.9)
Hepatitis B Surface Ag: NEGATIVE
Immature Grans (Abs): 0 10*3/uL (ref 0.0–0.1)
Immature Granulocytes: 0 %
Lymphocytes Absolute: 2.1 10*3/uL (ref 0.7–3.1)
Lymphs: 27 %
MCH: 27.6 pg (ref 26.6–33.0)
MCHC: 32.9 g/dL (ref 31.5–35.7)
MCV: 84 fL (ref 79–97)
Monocytes Absolute: 0.4 10*3/uL (ref 0.1–0.9)
Monocytes: 6 %
Neutrophils Absolute: 4.7 10*3/uL (ref 1.4–7.0)
Neutrophils: 62 %
Platelets: 208 10*3/uL (ref 150–450)
RBC: 4.16 x10E6/uL (ref 3.77–5.28)
RDW: 12.8 % (ref 11.7–15.4)
RPR Ser Ql: NONREACTIVE
Rh Factor: POSITIVE
Rubella Antibodies, IGG: 4.46 index (ref >0.99)
WBC: 7.6 10*3/uL (ref 3.4–10.8)

## 2020-06-20 LAB — HCV INTERPRETATION

## 2020-06-21 LAB — URINE CULTURE

## 2020-06-30 ENCOUNTER — Encounter: Payer: Self-pay | Admitting: Women's Health

## 2020-06-30 DIAGNOSIS — O285 Abnormal chromosomal and genetic finding on antenatal screening of mother: Secondary | ICD-10-CM | POA: Insufficient documentation

## 2020-07-14 NOTE — BH Specialist Note (Signed)
Pt did not arrive to video visit and did not answer the phone ; pt's mother answered [phone] and says that her daughter's number at [phone] is not in operation at this time, but that pt may be able to use an app to call; Left HIPPA-compliant message to call back Roselyn Reef from Center for [employer] at [facility] at [phone] [facility] office).  ; left MyChart message for patient.

## 2020-07-16 ENCOUNTER — Ambulatory Visit (INDEPENDENT_AMBULATORY_CARE_PROVIDER_SITE_OTHER): Payer: Medicaid Other | Admitting: Advanced Practice Midwife

## 2020-07-16 ENCOUNTER — Other Ambulatory Visit: Payer: Self-pay

## 2020-07-16 VITALS — BP 114/76 | HR 72 | Wt 111.0 lb

## 2020-07-16 DIAGNOSIS — Z3A15 15 weeks gestation of pregnancy: Secondary | ICD-10-CM

## 2020-07-16 DIAGNOSIS — Z124 Encounter for screening for malignant neoplasm of cervix: Secondary | ICD-10-CM

## 2020-07-16 DIAGNOSIS — F938 Other childhood emotional disorders: Secondary | ICD-10-CM

## 2020-07-16 DIAGNOSIS — Z363 Encounter for antenatal screening for malformations: Secondary | ICD-10-CM

## 2020-07-16 DIAGNOSIS — Z3482 Encounter for supervision of other normal pregnancy, second trimester: Secondary | ICD-10-CM

## 2020-07-16 NOTE — Progress Notes (Signed)
   LOW-RISK PREGNANCY VISIT Patient name: Sydney Higgins MRN [id number]  Date of birth: [birth year] Chief Complaint:   Routine Prenatal Visit  History of Present Illness:   Sydney Higgins is a 25 y.o. G86P0010 female at [redacted]w[redacted]d with an Estimated Date of Delivery: 01/03/21 being seen today for ongoing management of a low-risk pregnancy.  Today she reports no complaints. Contractions: Not present. Vag. Bleeding: None.  Movement: Absent. denies leaking of fluid. Review of Systems:   Pertinent items are noted in HPI Denies abnormal vaginal discharge w/ itching/odor/irritation, headaches, visual changes, shortness of breath, chest pain, abdominal pain, severe nausea/vomiting, or problems with urination or bowel movements unless otherwise stated above. Pertinent History Reviewed:  Reviewed past medical,surgical, social, obstetrical and family history.  Reviewed problem list, medications and allergies. Physical Assessment:   Vitals:   07/16/20 1528  BP: 114/76  Pulse: 72  Weight: 111 lb (50.3 kg)  Body mass index is 21.68 kg/m.        Physical Examination:   General appearance: Well appearing, and in no distress  Mental status: Alert, oriented to person, place, and time  Skin: Warm & dry  Cardiovascular: Normal heart rate noted  Respiratory: Normal respiratory effort, no distress  Abdomen: Soft, gravid, nontender  Pelvic:deferred        Extremities: Edema: None  Fetal Status: Fetal Heart Rate (bpm): 152   Movement: Absent    Chaperone: Amanda Rash    No results found for this or any previous visit (from the past 24 hour(s)).  Assessment & Plan:  1) Low-risk pregnancy G2P0010 at [redacted]w[redacted]d with an Estimated Date of Delivery: 01/03/21   2) anxiety, refer to [place]: No orders of the defined types were placed in this encounter.  Labs/procedures today: Pap  Plan:  Continue routine obstetrical care  Next visit: prefers will be in person for [place]    Reviewed: Preterm labor symptoms and  general obstetric precautions including but not limited to vaginal bleeding, contractions, leaking of fluid and fetal movement were reviewed in detail with the patient.  All questions were answered. Has home bp cuff. . Check bp weekly, let us know if >140/90.   Follow-up: Return in about 3 weeks (around 08/06/2020) for [id number], LROB.  Orders Placed This Encounter  Procedures  . US OB Comp + 14 Wk  . Ambulatory referral to [place], CNM 07/16/2020 4:23 PM

## 2020-07-17 ENCOUNTER — Other Ambulatory Visit: Payer: Self-pay | Admitting: Obstetrics & Gynecology

## 2020-07-17 MED ORDER — PRENATAL PLUS 27-1 MG PO TABS
1.0000 | ORAL_TABLET | Freq: Every day | ORAL | 4 refills | Status: AC
Start: 2020-07-17 — End: 2020-10-15

## 2020-07-17 NOTE — Progress Notes (Signed)
Rx sent for prenatal vitamin

## 2020-07-18 ENCOUNTER — Ambulatory Visit: Payer: Federal, State, Local not specified - Other | Admitting: Clinical

## 2020-07-18 DIAGNOSIS — Z91199 Patient's noncompliance with other medical treatment and regimen due to unspecified reason: Secondary | ICD-10-CM

## 2020-07-18 DIAGNOSIS — Z5329 Procedure and treatment not carried out because of patient's decision for other reasons: Secondary | ICD-10-CM

## 2020-08-06 ENCOUNTER — Other Ambulatory Visit: Payer: Self-pay | Admitting: Student

## 2020-08-06 ENCOUNTER — Other Ambulatory Visit: Payer: Self-pay

## 2020-08-06 ENCOUNTER — Inpatient Hospital Stay (HOSPITAL_BASED_OUTPATIENT_CLINIC_OR_DEPARTMENT_OTHER): Payer: Medicaid Other

## 2020-08-06 ENCOUNTER — Encounter (HOSPITAL_COMMUNITY): Payer: Self-pay | Admitting: Obstetrics and Gynecology

## 2020-08-06 ENCOUNTER — Inpatient Hospital Stay (HOSPITAL_COMMUNITY)
Admission: AD | Admit: 2020-08-06 | Discharge: 2020-08-06 | Disposition: A | Discharge status: Home / Self Care | Payer: Medicaid Other | Attending: Obstetrics and Gynecology | Admitting: Obstetrics and Gynecology

## 2020-08-06 DIAGNOSIS — R109 Unspecified abdominal pain: Secondary | ICD-10-CM | POA: Diagnosis not present

## 2020-08-06 DIAGNOSIS — Z599 Problem related to housing and economic circumstances, unspecified: Secondary | ICD-10-CM | POA: Diagnosis not present

## 2020-08-06 DIAGNOSIS — O321XX0 Maternal care for breech presentation, not applicable or unspecified: Secondary | ICD-10-CM | POA: Diagnosis not present

## 2020-08-06 DIAGNOSIS — Z87891 Personal history of nicotine dependence: Secondary | ICD-10-CM | POA: Insufficient documentation

## 2020-08-06 DIAGNOSIS — Z3A18 18 weeks gestation of pregnancy: Secondary | ICD-10-CM

## 2020-08-06 DIAGNOSIS — O26892 Other specified pregnancy related conditions, second trimester: Secondary | ICD-10-CM

## 2020-08-06 DIAGNOSIS — Z59819 Housing instability, housed unspecified: Secondary | ICD-10-CM

## 2020-08-06 DIAGNOSIS — Z5989 Other problems related to housing and economic circumstances: Secondary | ICD-10-CM | POA: Diagnosis not present

## 2020-08-06 LAB — WET PREP, GENITAL
Clue Cells Wet Prep HPF POC: NONE SEEN
Sperm: NONE SEEN
Trich, Wet Prep: NONE SEEN
Yeast Wet Prep HPF POC: NONE SEEN

## 2020-08-06 LAB — URINALYSIS, ROUTINE W REFLEX MICROSCOPIC
Bilirubin Urine: NEGATIVE
Glucose, UA: NEGATIVE mg/dL
Hgb urine dipstick: NEGATIVE
Ketones, ur: NEGATIVE mg/dL
Leukocytes,Ua: NEGATIVE
Nitrite: NEGATIVE
Protein, ur: NEGATIVE mg/dL
Specific Gravity, Urine: 1.001 — ABNORMAL LOW (ref 1.005–1.030)
pH: 7 (ref 5.0–8.0)

## 2020-08-06 NOTE — Discharge Instructions (Signed)
Abdominal Pain During Pregnancy Belly (abdominal) pain is common during pregnancy. There are many possible causes. Some causes are more serious than others. Sometimes the cause is not known. Always tell your doctor if you have belly pain. Follow these instructions at home:  Do not have sex or put anything in your vagina until your pain goes away completely.  Get plenty of rest until your pain gets better.  Drink enough fluid to keep your pee (urine) pale yellow.  Take over-the-counter and prescription medicines only as told by your doctor.  Keep all follow-up visits.   Contact a doctor if:  You keep having pain after resting.  Your pain gets worse after resting.  You have lower belly pain that: ? Comes and goes at regular times. ? Spreads to your back. ? Feels like menstrual cramps.  You have pain or burning when you pee (urinate). Get help right away if:  You have a fever or chills.  You feel like it is hard to breathe.  You have bleeding from your vagina.  You are leaking fluid or tissue from your vagina.  You vomit for more than 24 hours.  You have watery poop (diarrhea) for more than 24 hours.  Your baby is moving less than usual.  You feel very weak or faint.  You have very bad pain in your upper belly. Summary  Belly pain is common during pregnancy. There are many possible causes.  If you have belly pain during pregnancy, tell your doctor right away.  Keep all follow-up visits. This information is not intended to replace advice given to you by your health care provider. Make sure you discuss any questions you have with your health care provider. Document Revised: 11/27/2019 Document Reviewed: 11/27/2019 Elsevier Patient Education  2021 [employer]vier Inc.  

## 2020-08-06 NOTE — MAU Note (Signed)
Pt arrived via EMS with decreased fetal movement for 2 days. Also reports Lower abdominal pain that is sharp intermittently and dull constantly. Also has nausea and vomiting. Denies VB. States she lost her mucous plug. Reports thick yellow discharge.

## 2020-08-06 NOTE — MAU Provider Note (Signed)
History     CSN: [id number]  Arrival date and time: 08/06/20 [id number]   Event Date/Time   First Provider Initiated Contact with Patient 08/06/20 [phone]      Chief Complaint  Patient presents with  . Decreased Fetal Movement  . Abdominal Pain  . Nausea   HPI Sydney Higgins is a 25 y.o. G2P0010 at [redacted]w[redacted]d who presents via EMS for abdominal pain, nausea & decreased fetal movement. Symptoms started this morning.  Reports constant dull aching in her lower abdomen. Also has intermittent sharp pains. Sometimes the pain occurs with movement & other times it occurs when just lying still. Can't tell how frequently it's occurring. Rates pain 6/10. Hasn't treated symptoms. Nothing makes better or worse. Pain is associated with nausea & vomiting. States early pregnancy n/v had resolved & this is new. States vomited once this morning due to the pain. Currently is not nauseated. Has been having issues with constipation but reports a normal bowel movement this morning. Noticed thick yellow mucoid discharge last night. No odor or vaginal irritation. Denies vaginal bleeding. Denies fever/chills or dysuria. Hadn't felt baby move in the last 2 days.  Last intercourse was yesterday. Goes to [facility] but is now living in a [place] & does not have transportation to get to her future ob appointments in [place].    OB History    Gravida  2   Para      Term      Preterm      AB  1   Living  0     SAB  1   IAB      Ectopic      Multiple      Live Births              Past Medical History:  Diagnosis Date  . Anxiety   . 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 DMDD (disruptive mood dysregulation disorder) ([place]) 12/11/2015  . Headache   . Insomnia 12/11/2015  . 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 MASS EXCISION Left 08/26/2017   Procedure: EXCISION SUPERFICIAL MASS FROM LEFT  SIDE OF NECK;  Surgeon: Virl Cagey, MD;  Location: [facility];  Service: General;  Laterality: Left;    Family History  Problem Relation Age of Onset  . Cancer Paternal Grandfather   . Cancer Paternal Grandmother   . Diabetes Maternal Grandmother   . Diabetes Maternal Grandfather   . Hypertension Father   . Bipolar disorder Brother   . Deafness Sister   . Diabetes Maternal Uncle     Social History   Tobacco Use  . Smoking status: Former Smoker    Types: E-cigarettes  . Smokeless tobacco: Never Used  Vaping Use  . Vaping Use: Every day  Substance Use Topics  . Alcohol use: Not Currently  . Drug use: Yes    Types: Marijuana    Comment: occ    Allergies:  Allergies  Allergen Reactions  . Apple Other (See Comments)    Can eat the fruit, but not the peel Mouth itches  . Banana Other (See Comments)    Pt prefrence  . Bee Venom Swelling  . Pear Other (See Comments)    Mouth itches    No medications prior to admission.    Review of Systems  Constitutional: Negative.   Gastrointestinal: Positive for abdominal  pain, constipation, nausea and vomiting. Negative for diarrhea.  Genitourinary: Positive for vaginal discharge. Negative for dysuria and vaginal bleeding.   Physical Exam   Blood pressure 107/61, pulse 60, temperature 98.5 F (36.9 C), temperature source Oral, resp. rate 18, last menstrual period 03/29/2020, SpO2 100 %.  Physical Exam Vitals and nursing note reviewed. Exam conducted with a chaperone present.  Constitutional:      General: She is not in acute distress.    Appearance: She is well-developed and normal weight. She is not ill-appearing.  HENT:     Head: Normocephalic and atraumatic.  Eyes:     General: No scleral icterus. Pulmonary:     Effort: Pulmonary effort is normal. No respiratory distress.  Abdominal:     General: Abdomen is flat.     Palpations: Abdomen is soft.     Tenderness: There is no abdominal tenderness.     Comments:  Mild contraction palpated  Skin:    General: Skin is warm and dry.  Neurological:     Mental Status: She is alert.  Psychiatric:        Mood and Affect: Mood normal.        Behavior: Behavior normal.     MAU Course  Procedures Results for orders placed or performed during the hospital encounter of 08/06/20 (from the past 24 hour(s))  Urinalysis, Routine w reflex microscopic Urine, Clean Catch     Status: Abnormal   Collection Time: 08/06/20  8:13 AM  Result Value Ref Range   Color, Urine STRAW (A) YELLOW   APPearance CLEAR CLEAR   Specific Gravity, Urine 1.001 (L) 1.005 - 1.030   pH 7.0 5.0 - 8.0   Glucose, UA NEGATIVE NEGATIVE mg/dL   Hgb urine dipstick NEGATIVE NEGATIVE   Bilirubin Urine NEGATIVE NEGATIVE   Ketones, ur NEGATIVE NEGATIVE mg/dL   Protein, ur NEGATIVE NEGATIVE mg/dL   Nitrite NEGATIVE NEGATIVE   Leukocytes,Ua NEGATIVE NEGATIVE  Wet prep, genital     Status: Abnormal   Collection Time: 08/06/20  8:57 AM  Result Value Ref Range   Yeast Wet Prep HPF POC NONE SEEN NONE SEEN   Trich, Wet Prep NONE SEEN NONE SEEN   Clue Cells Wet Prep HPF POC NONE SEEN NONE SEEN   WBC, Wet Prep HPF POC MANY (A) NONE SEEN   Sperm NONE SEEN    [place] MFM OB LIMITED  Result Date: 08/06/2020 ----------------------------------------------------------------------  OBSTETRICS REPORT                       (Signed Final 08/06/2020 11:41 am) ---------------------------------------------------------------------- Patient Info  ID #:       [id number]                          D.O.B.:  [birth year] (24 yrs)  Name:       Sydney Higgins                     Visit Date: 08/06/2020 09:07 am ---------------------------------------------------------------------- Performed By  Attending:        Tama High MD        Secondary Phy.:   Jorje Guild  CNM  Performed By:     Georgie Chard        Address:          [place]  Referred By:      [facility] MA[facility]/Triage         [facility]                                                             [place] ---------------------------------------------------------------------- Orders  #  Description                           Code        Ordered By  1  [place] MFM OB LIMITED                     57846.96    Jorje Guild ----------------------------------------------------------------------  #  Order #                     Accession #                Episode #  1  [id number]                   [id number]                 [id number] ---------------------------------------------------------------------- Indications  Abdominal pain in pregnancy                    O99.89  Encounter for cervical length                  Z36.86  [redacted] weeks gestation of pregnancy                Z3A.18 ---------------------------------------------------------------------- Fetal Evaluation  Num Of Fetuses:         1  Fetal Heart Rate(bpm):  155  Cardiac Activity:       Observed  Presentation:           Breech  Placenta:               Anterior  P. Cord Insertion:      Visualized  Amniotic Fluid  AFI FV:      Within normal limits                              Largest Pocket(cm)                              3.9 ---------------------------------------------------------------------- Biometry  LV:        6.1  mm ---------------------------------------------------------------------- OB History  Gravidity:    2         Term:   0        Prem:   0        SAB:   1  TOP:          0       Ectopic:  0        Living: 0 ---------------------------------------------------------------------- Gestational Age  LMP:           18w 4d        Date:  03/29/20                 EDD:   01/03/21  Best:          Renae Fickle 4d     Det. By:  LMP  (03/29/20)          EDD:   01/03/21 ---------------------------------------------------------------------- Anatomy  Cranium:               Appears normal          Stomach:                Appears normal, left                                                                        sided  Ventricles:            Appears normal         Kidneys:                Appear normal  Diaphragm:             Appears normal         Bladder:                Appears normal ---------------------------------------------------------------------- Cervix Uterus Adnexa  Cervix  Length:           3.27  cm.  Normal appearance by transabdominal scan.  Uterus  No abnormality visualized.  Right Ovary  Not visualized.  Left Ovary  Within normal limits.  Cul De Sac  No free fluid seen.  Adnexa  No abnormality visualized. No adnexal mass  visualized. No free fluid. ---------------------------------------------------------------------- Impression  Patient was evaluated for abdominal pain . No history of  vaginal bleeding.  A limited ultrasound study was performed .Amniotic fluid is  normal and good fetal activity is seen. On transabdominal  scan, the cervix looks long and closed . Placenta appears  normal. ----------------------------------------------------------------------                  Tama High, MD Electronically Signed Final Report   08/06/2020 11:41 am ----------------------------------------------------------------------   MDM FHT present via doppler Mild contraction palpated x 1. Otherwise, patient does not appear to be uncomfortable. Declines meds in MAU.  Cervix closed. U/s ordered & cervical length is 3.2 cm.   U/a & wet prep negative  Pt has issues with housing & transportation. Social work saw patient in MAU to assist with resources. If patient still in [place], will work on [employer] provided transportation to her [facility] appt next week - after that will transfer to [place] since it is closer for her. Possibility that patient will go  to shelter in [place] so she can stay with her significant other - will need to find local provider if that happens. Patient to  call [facility] when she finds out where she's staying next week so transportation can be set up.    Assessment and Plan   1. Abdominal pain during pregnancy in second trimester   2. [redacted] weeks gestation of pregnancy   3. Housing situation unstable    -GC/CT pending -reviewed reasons to return to MAU -message sent to [facility] to inform of housing & transportation issues.   Jorje Guild 08/06/2020, 1:11 PM

## 2020-08-06 NOTE — Social Work (Signed)
CSW received consult to provide housing and transportation resources   CSW met with Sydney Higgins at bedside. CSW explain role and reason for the visit. Sydney Higgins pleasant and receptive to CSW. Sydney Higgins reports she is currently [redacted] weeks pregnant; this is her first pregnancy. CSW assessed Sydney Higgins for safety. Sydney Higgins denies thoughts of harm to self and others. Sydney Higgins denies domestic violence.   Sydney Higgins reports she live with her significant other at [facility] in [place] and today is the last they can stay at the motel. Sydney Higgins reports they have no funds to afford the rent and she is currently unemployed. Sydney Higgins reports her significant other lost his job because he did not have reliable transportation. Sydney Higgins she was temporarily staying in the home with her mother however her mother's home is no longer an option at this time.  Sydney Higgins reports she reached out to several shelters in the area for a shelter bed. Sydney Higgins reports it has been challenging to find a place that will accommodate her and her significant other. Sydney Higgins reports she does not want to stay in a shelter without him. Sydney Higgins reports she suffers from anxiety which is currently managed with medications. Sydney Higgins reports there was a shelter opening for her in [place] however Sydney Higgins decline the bed because she did not want to leave her significant or travel too far. CSW provided active listening and acknowledged that it is difficult to find a shelter that will take couples.   CSW provided Sydney Higgins with a list of shelter in the area. CSW reviewed the list with Sydney Higgins. CSW provided information about [facility]) and assisted in calling area shelter and a shelter in [place] that could not guarantee a bed. CSW informed Sydney Higgins to call at 5:00 pm to determine if space is available. Sydney Higgins voiced understanding. Sydney Higgins was agreeable to utilize the [facility] services. CSW also informed Sydney Higgins about [facility] and [place]. Sydney Higgins gave CSW permission to make a referral on her behalf. Sydney Higgins reports  she does not have a reliable phone and gave her mother's number as alternate contact. CSW inquired about Sydney Higgins transportation needs to her prenatal appointments at [facility]. Sydney Higgins reports the nursing staff were trying to help coordinate transportation to appointments. CSW provided contact information for Medicaid transportation services as an alternate. Sydney Higgins reports she plans to find a OBGYN office in the [place] area. CSW also provided Sydney Higgins with contact information for [facility] the [place] and explain their services. CSW inquired if Sydney Higgins received food stamps or WIC for food. Sydney Higgins reports she receives food stamps and [place] services.  Sydney Higgins plan is to go over to the [facility] today to sign up for their services. CSW provided Sydney Higgins with ([place] bus [place].   Sydney Higgins was appreciative of CSW intervention.   Kathrin Greathouse, MSW, LCSW [facility] [place] Worker  [phone] 08/06/2020  12:42 PM

## 2020-08-07 LAB — GC/CHLAMYDIA PROBE AMP (~~LOC~~) NOT AT ARMC
Chlamydia: NEGATIVE
Comment: NEGATIVE
Comment: NORMAL
Neisseria Gonorrhea: NEGATIVE

## 2020-08-12 ENCOUNTER — Encounter: Payer: Self-pay | Admitting: *Deleted

## 2020-08-13 ENCOUNTER — Encounter: Payer: Medicaid Other | Admitting: Advanced Practice Midwife

## 2020-08-13 ENCOUNTER — Other Ambulatory Visit: Payer: Medicaid Other

## 2020-08-13 ENCOUNTER — Encounter: Payer: Medicaid Other | Admitting: Obstetrics and Gynecology

## 2020-08-27 LAB — HEPATITIS C AB, BLOOD: Hepatitis C Ab: NONREACTIVE

## 2020-08-27 LAB — HEPATITIS B SURFACE AG, BLOOD: HBsAg: NONREACTIVE

## 2020-08-27 LAB — HIV 1/2 ANTIBODY & P24 ANTIGEN ASSAY, BLOOD: HIV 1/2 Antibody & P24 Antigen Assay: NONREACTIVE

## 2020-08-27 LAB — QUANTIFERON-TB, BLOOD: Quantiferon TB: 0.02

## 2020-08-27 LAB — VARICELLA IGG ANTIBODY (ELISA), BLOOD: Varicella IGG Ab: IMMUNE

## 2020-08-27 LAB — GLUCOSE, 1HR POST DOSE BLOOD: Glu, 1Hr Post Dose: 107

## 2020-08-27 LAB — RUBELLA IGG ANTIBODY, BLOOD: Rubella IGG Ab (EIA): IMMUNE

## 2020-08-27 LAB — OB RESULTS CONSOLE HEPATITIS B SURFACE ANTIGEN: Hepatitis B Surface Ag: NEGATIVE

## 2020-08-27 LAB — OB RESULTS CONSOLE RUBELLA ANTIBODY, IGM: Rubella: IMMUNE

## 2020-08-27 LAB — OB RESULTS CONSOLE VARICELLA ZOSTER ANTIBODY, IGG: Varicella: IMMUNE

## 2020-08-27 LAB — OB RESULTS CONSOLE RPR: RPR: NONREACTIVE

## 2020-08-27 LAB — HEPATITIS C ANTIBODY: HCV Ab: NEGATIVE

## 2020-09-11 ENCOUNTER — Telehealth: Payer: Self-pay | Admitting: Clinical

## 2020-09-11 NOTE — Telephone Encounter (Signed)
Attempt to reschedule virtual consult with [facility]; Left HIPPA-compliant message to call back Roselyn Reef from [employer] for [employer] at [facility] at  [phone] [facility] office).

## 2020-10-07 ENCOUNTER — Encounter (HOSPITAL_COMMUNITY): Payer: Self-pay

## 2020-10-07 ENCOUNTER — Inpatient Hospital Stay
Admission: AD | Admit: 2020-10-07 | Discharge: 2020-10-07 | DRG: 833 | Discharge status: Against Medical Advice | Payer: MEDICAID | Attending: Obstetrics & Gynecology | Admitting: Obstetrics & Gynecology

## 2020-10-07 ENCOUNTER — Encounter (HOSPITAL_COMMUNITY): Payer: Self-pay | Admitting: Obstetrics & Gynecology

## 2020-10-07 DIAGNOSIS — Z87410 Personal history of cervical dysplasia: Secondary | ICD-10-CM

## 2020-10-07 DIAGNOSIS — O09212 Supervision of pregnancy with history of pre-term labor, second trimester: Secondary | ICD-10-CM

## 2020-10-07 DIAGNOSIS — O2612 Low weight gain in pregnancy, second trimester: Secondary | ICD-10-CM | POA: Diagnosis present

## 2020-10-07 DIAGNOSIS — O4702 False labor before 37 completed weeks of gestation, second trimester: Secondary | ICD-10-CM

## 2020-10-07 DIAGNOSIS — O99891 Other specified diseases and conditions complicating pregnancy: Secondary | ICD-10-CM

## 2020-10-07 DIAGNOSIS — O09292 Supervision of pregnancy with other poor reproductive or obstetric history, second trimester: Secondary | ICD-10-CM

## 2020-10-07 DIAGNOSIS — Z3A27 27 weeks gestation of pregnancy: Secondary | ICD-10-CM

## 2020-10-07 DIAGNOSIS — Z8659 Personal history of other mental and behavioral disorders: Secondary | ICD-10-CM

## 2020-10-07 DIAGNOSIS — O0972 Supervision of high risk pregnancy due to social problems, second trimester: Secondary | ICD-10-CM

## 2020-10-07 DIAGNOSIS — O212 Late vomiting of pregnancy: Secondary | ICD-10-CM | POA: Diagnosis present

## 2020-10-07 DIAGNOSIS — Z20822 Contact with and (suspected) exposure to covid-19: Secondary | ICD-10-CM | POA: Diagnosis present

## 2020-10-07 DIAGNOSIS — Z5329 Procedure and treatment not carried out because of patient's decision for other reasons: Secondary | ICD-10-CM | POA: Diagnosis not present

## 2020-10-07 LAB — UR DRUGS OF ABUSE SCREEN
Amphetamines Screen: NEGATIVE
Barbiturates Screen: NEGATIVE
Benzodiazepine Screen: NEGATIVE
Cocaine Screen: NEGATIVE
Methadone Screen: NEGATIVE
Opiates Screen: NEGATIVE
Oxycodone Screen: NEGATIVE
Phencyclidine Screen: NEGATIVE
UR Fentanyl Screen: NEGATIVE

## 2020-10-07 LAB — CBC WITH DIFF, BLOOD
ANC-Automated: 7.4 10*3/uL — ABNORMAL HIGH (ref 1.6–7.0)
Abs Basophils: 0.1 10*3/uL — ABNORMAL HIGH (ref <0.1)
Abs Eosinophils: 0.2 10*3/uL (ref 0.0–0.5)
Abs Lymphs: 2.4 10*3/uL (ref 0.8–3.1)
Abs Monos: 0.7 10*3/uL (ref 0.2–0.8)
Basophils: 1 %
Eosinophils: 2 %
Hct: 33 % — ABNORMAL LOW (ref 34.0–45.0)
Hgb: 10.9 gm/dL — ABNORMAL LOW (ref 11.2–15.7)
Imm Gran Abs: 0.1 10*3/uL — ABNORMAL HIGH (ref <0.1)
Lymphocytes: 22 %
MCH: 28.1 pg (ref 26.0–32.0)
MCHC: 33 g/dL (ref 32.0–36.0)
MCV: 85.1 um3 (ref 79.0–95.0)
MPV: 11.3 fL (ref 9.4–12.4)
Monocytes: 6 %
Plt Count: 183 10*3/uL (ref 140–370)
RBC: 3.88 10*6/uL — ABNORMAL LOW (ref 3.90–5.20)
RDW: 12.1 % (ref 12.0–14.0)
Segs: 69 %
WBC: 10.7 10*3/uL — ABNORMAL HIGH (ref 4.0–10.0)

## 2020-10-07 LAB — VAGINOSIS SCREEN
Candida, Vaginal Screen: NEGATIVE
Gardnerella, Vaginal Screen: NEGATIVE
Trichomonas, Vaginal Screen: NEGATIVE

## 2020-10-07 LAB — ABO/RH CONFIRMATION: ABO/RH: B POS

## 2020-10-07 LAB — URINALYSIS
Bilirubin: NEGATIVE
Blood: NEGATIVE
Glucose: NEGATIVE
Leuk Esterase: NEGATIVE Leu/uL
Nitrite: NEGATIVE
Protein: NEGATIVE
Specific Gravity: 1.01 (ref 1.002–1.030)
Urobilinogen: NEGATIVE
pH: 6 (ref 5.0–8.0)

## 2020-10-07 LAB — TYPE & SCREEN
ABO/RH: B POS
Antibody Screen: NEGATIVE

## 2020-10-07 LAB — INFLUENZA A/B & SARS-COV-2 PCR COMBO FOR RAPID RESPONSE LAB
Influenza A PCR, RRL: NOT DETECTED
Influenza B PCR, RRL: NOT DETECTED
SARS-CoV-2 PCR, RRL: NOT DETECTED

## 2020-10-07 LAB — CONFIRM CANNABINOIDS-URINE BY LC-MSMS, QUALITATIVE: Conf. THC: POSITIVE ng/mL — AB

## 2020-10-07 LAB — GLUCOSE (POCT)
Glucose (POCT): 110 mg/dL — ABNORMAL HIGH (ref 70–99)
Glucose (POCT): 134 mg/dL — ABNORMAL HIGH (ref 70–99)

## 2020-10-07 LAB — GROUP B STREPTOCOCCUS NUCLEIC ACID DETECTION SCREEN

## 2020-10-07 MED ORDER — GLUCAGON HCL (RDNA) 1 MG IJ SOLR
1.0000 mg | Freq: Once | INTRAMUSCULAR | Status: DC | PRN
Start: 2020-10-07 — End: 2020-10-07

## 2020-10-07 MED ORDER — PENICILLIN G POTASSIUM 5000000 UNIT IJ SOLR
5.0000 10*6.[IU] | Freq: Once | INTRAVENOUS | Status: AC
Start: 2020-10-07 — End: 2020-10-07
  Administered 2020-10-07: 5 10*6.[IU] via INTRAVENOUS
  Filled 2020-10-07: qty 5

## 2020-10-07 MED ORDER — SOD CITRATE-CITRIC ACID 500-334 MG/5ML OR SOLN
30.0000 mL | Freq: Once | ORAL | Status: DC | PRN
Start: 2020-10-07 — End: 2020-10-07

## 2020-10-07 MED ORDER — MISOPROSTOL 200 MCG OR TABS
800.0000 ug | ORAL_TABLET | Freq: Once | ORAL | Status: DC | PRN
Start: 2020-10-07 — End: 2020-10-07

## 2020-10-07 MED ORDER — GLUCOSE 4 GM PO CHEW (CUSTOM)
4.0000 | CHEWABLE_TABLET | ORAL | Status: DC | PRN
Start: 2020-10-07 — End: 2020-10-07

## 2020-10-07 MED ORDER — VITAFOL-OB OR TABS
1.0000 | ORAL_TABLET | Freq: Every day | ORAL | Status: DC
Start: 2020-10-07 — End: 2020-10-07
  Administered 2020-10-07: 1 via ORAL
  Filled 2020-10-07: qty 1

## 2020-10-07 MED ORDER — MAGNESIUM SULFATE 10% IN NS 130 ML (PREMADE)
2.0000 g/h | Status: DC
Start: 2020-10-07 — End: 2020-10-07
  Administered 2020-10-07: 2 g/h via INTRAVENOUS
  Filled 2020-10-07: qty 130

## 2020-10-07 MED ORDER — MULTI-VITAMINS PO TABS: 1.0000 | ORAL_TABLET | Freq: Every day | ORAL | Status: AC

## 2020-10-07 MED ORDER — OXYTOCIN INFUSION 40 UNITS/1000 ML NS (PREMIX)
Freq: Once | Status: DC | PRN
Start: 2020-10-07 — End: 2020-10-07

## 2020-10-07 MED ORDER — CARBOPROST TROMETHAMINE 250 MCG/ML IM SOLN
250.0000 ug | Freq: Once | INTRAMUSCULAR | Status: DC | PRN
Start: 2020-10-07 — End: 2020-10-07

## 2020-10-07 MED ORDER — DIPHENOXYLATE-ATROPINE 2.5-0.025 MG OR TABS
1.0000 | ORAL_TABLET | Freq: Once | ORAL | Status: DC | PRN
Start: 2020-10-07 — End: 2020-10-07

## 2020-10-07 MED ORDER — PENICILLIN G POT IN DEXTROSE 60000 UNIT/ML IV SOLN
3.0000 10*6.[IU] | INTRAVENOUS | Status: DC
Start: 2020-10-07 — End: 2020-10-07
  Administered 2020-10-07 (×2): 3 10*6.[IU] via INTRAVENOUS
  Filled 2020-10-07: qty 50

## 2020-10-07 MED ORDER — TRANEXAMIC ACID IN NACL 1000 MG/100ML IV SOLN
1000.0000 mg | Freq: Once | INTRAVENOUS | Status: DC | PRN
Start: 2020-10-07 — End: 2020-10-07

## 2020-10-07 MED ORDER — SODIUM CHLORIDE 0.9% TKO INFUSION
INTRAVENOUS | Status: DC | PRN
Start: 2020-10-07 — End: 2020-10-07

## 2020-10-07 MED ORDER — ACETAMINOPHEN 325 MG PO TABS
650.0000 mg | ORAL_TABLET | Freq: Four times a day (QID) | ORAL | Status: DC | PRN
Start: 2020-10-07 — End: 2020-10-07

## 2020-10-07 MED ORDER — METHYLERGONOVINE MALEATE 0.2 MG/ML IJ SOLN
0.2000 mg | Freq: Once | INTRAMUSCULAR | Status: DC | PRN
Start: 2020-10-07 — End: 2020-10-07

## 2020-10-07 MED ORDER — OXYTOCIN 10 UNIT/ML IJ SOLN
10.0000 [IU] | Freq: Once | INTRAMUSCULAR | Status: DC | PRN
Start: 2020-10-07 — End: 2020-10-07

## 2020-10-07 MED ORDER — OXYTOCIN INFUSION 20 UNITS/1000 ML LR (PREMIX)
Status: DC | PRN
Start: 2020-10-07 — End: 2020-10-07

## 2020-10-07 MED ORDER — MULTI-VITAMINS PO TABS
1.0000 | ORAL_TABLET | Freq: Every day | ORAL | Status: DC
Start: 2020-10-07 — End: 2020-10-07
  Filled 2020-10-07 (×3): qty 1

## 2020-10-07 MED ORDER — INSULIN LISPRO (HUMAN) 100 UNIT/ML SC SOLN (CUSTOM)
0.0000 [IU] | Freq: Every morning | INTRAMUSCULAR | Status: DC
Start: 2020-10-07 — End: 2020-10-07

## 2020-10-07 MED ORDER — DEXTROSE (DIABETIC USE) 40 % OR GEL
1.0000 | ORAL | Status: DC | PRN
Start: 2020-10-07 — End: 2020-10-07

## 2020-10-07 MED ORDER — ONDANSETRON HCL 4 MG/2ML IV SOLN
4.0000 mg | Freq: Four times a day (QID) | INTRAMUSCULAR | Status: DC | PRN
Start: 2020-10-07 — End: 2020-10-07

## 2020-10-07 MED ORDER — SODIUM CHLORIDE 0.9 % IJ SOLN (CUSTOM)
3.0000 mL | Freq: Three times a day (TID) | INTRAMUSCULAR | Status: DC
Start: 2020-10-07 — End: 2020-10-07

## 2020-10-07 MED ORDER — ONDANSETRON HCL 4 MG/2ML IV SOLN
4.0000 mg | Freq: Four times a day (QID) | INTRAMUSCULAR | Status: DC | PRN
Start: 2020-10-07 — End: 2020-10-07
  Administered 2020-10-07: 4 mg via INTRAVENOUS
  Filled 2020-10-07: qty 2

## 2020-10-07 MED ORDER — DEXTROSE 50 % IV SOLN
12.5000 g | INTRAVENOUS | Status: DC | PRN
Start: 2020-10-07 — End: 2020-10-07

## 2020-10-07 MED ORDER — INSULIN LISPRO (HUMAN) 100 UNIT/ML SC SOLN (CUSTOM)
0.0000 [IU] | Freq: Four times a day (QID) | INTRAMUSCULAR | Status: DC
Start: 2020-10-07 — End: 2020-10-07

## 2020-10-07 MED ORDER — MAGNESIUM SULFATE 10% IN NS 130 ML (PREMADE)
4.0000 g | Freq: Once | Status: AC
Start: 2020-10-07 — End: 2020-10-07
  Administered 2020-10-07 (×2): 4 g via INTRAVENOUS
  Filled 2020-10-07: qty 130

## 2020-10-07 MED ORDER — TERBUTALINE SULFATE 1 MG/ML IJ SOLN
0.2500 mg | INTRAMUSCULAR | Status: DC | PRN
Start: 2020-10-07 — End: 2020-10-07

## 2020-10-07 MED ORDER — LIDOCAINE HCL 1 % IJ SOLN
1.0000 mL | Freq: Once | INTRAMUSCULAR | Status: DC | PRN
Start: 2020-10-07 — End: 2020-10-07

## 2020-10-07 MED ORDER — SODIUM CHLORIDE 0.9 % IJ SOLN (CUSTOM)
3.0000 mL | INTRAMUSCULAR | Status: DC | PRN
Start: 2020-10-07 — End: 2020-10-07

## 2020-10-07 MED ORDER — BETAMETHASONE ACET & SOD PHOS 6 (3-3) MG/ML IJ SUSP
12.0000 mg | INTRAMUSCULAR | Status: DC
Start: 2020-10-07 — End: 2020-10-07
  Administered 2020-10-07 (×2): 12 mg via INTRAMUSCULAR
  Filled 2020-10-07: qty 2

## 2020-10-07 MED ORDER — LIDOCAINE HCL (PF) 1 % IJ SOLN
20.0000 mL | Freq: Once | INTRAMUSCULAR | Status: DC | PRN
Start: 2020-10-07 — End: 2020-10-07

## 2020-10-07 NOTE — Progress Notes (Signed)
Antepartum Transfer Note    Primary OB/GYN: No PNC here, some PNC in [place]  Admit Date: 10/07/2020 / Hospital Day: 9 Hours      Angelie Gockel is a 25 year old G2P0010 at [redacted]w[redacted]d transferred from [facility] for c/f PTL.     24 hour events  -none    S: Pt reports she is doing well this morning. She feels painful ~6/10 contractions every 10 minutes. Is able to talk through them.     She denies any LOF, VB, dysuria, or pre-eclampsia symptoms including headache not relieved by tylenol, vision changes or loss of vision, RUQ pain, significant increased non-dependent edema. Reports feeling FM.     Meds   betamethasone acetate-betamethasone sodium PHOSphate  12 mg Q24H NR    multivitamin  1 tablet Daily    penicillin G potassium  3 Million Units Q4H NR    prenatal vitamin  1 tablet Daily    sodium chloride  3 mL Q8H       O: Temperature:  [98.2 F (36.8 C)-98.4 F (36.9 C)] 98.4 F (36.9 C) (07/12 0759)  Blood pressure (BP): (111-126)/(69-70) 126/69 (07/12 0759)  Heart Rate:  [66-73] 73 (07/12 0759)  Respirations:  [16-18] 16 (07/12 0759)  Pain Score: 0 (07/12 0759)  O2 Device: None (Room air) (07/12 0759)  SpO2:  [98 %] 98 % (07/12 0900)  No intake or output data in the 24 hours ending 10/07/20 0954    Gen- Thin body habitus, resting, appears well, comfortable  CVR - Heart regular rate and rhythm, normal work of breathing on RA   Abdomen- gravid, without fundal tenderness  Back - costovertebral angle tenderness absent   Ext- No Edema, Reflexes normal, clonus absent    PM:  FHT: 130's, moderate variability, present accelerations, absent decelerations.  TOCO: Q 2-4 minutes    At [facility] - SVE: L/C/H on admission, changed to FT/50/H over 4 hrs  SVE 7/12 @1000 : unchanged    Labs/Studies:  Recent Labs     10/07/20  0200   WBC 10.7*   HGB 10.9*   HCT 33.0*   PLT 183   SEG 69   LYMPHS 22   MONOS 6   MCV 85.1   MCHC 33.0   RDW 12.1   MPV 11.3     Recent Labs     10/07/20  0200   PLT 183   HCT 33.0*     No results for input(s): MG in  the last 72 hours.     Most recent Sono: Bedside Sono (10/07/20)  - cephalic, placenta fundal, DVP 4.7cm (by 2.7cm)  Bedside biometry c/w [redacted]w[redacted]d, 922g  CL on admission 2.7-3cm, dynamic to 2.6cm with valsalva    Assessment &Plan  Ellis Koffler is a 25 year old G2P0010 at [redacted]w[redacted]d,  Admitted 10/07/2020 for r/o preterm labor. Cervical change occurred during observation in triage. Dr. 12/08/2020 discussed with PNA on call (Dr. Joanne Gavel) and L&D team at [facility] and they have accepted her transfer for proximity to Level 3 NICU.    #. Likely threatened PTL    Pt has multiple risk factors for PTL including African American race, low weight, low SES/housing instability, cannabis use. At [facility] SVE changed from closed to FT over 4 hours. Pt also reporting gush of fluid, likely urine as neg fern/pool/nitrazine. FFN unable to be collected due to prior digital exam. Currently pt reporting painful ctx q76min with toco showing q76min. SVE unchanged this morning 0.5/50/-4 from 5 hours  prior. At this time, clinical picture not c/w PTL given normal TVCL, minimal cervical dilation. However will continue inpatient monitoring given persistent painful contractions  - BMZ 7/12-13  -dc'ed Mg gtt and PCN prophylaxis  -Repeat SVE PRN     #. Social Issues  #H/o bulimia, poor weight gain in pregnancy  Pt recently moved from [place] with FOB. Reports unstable housing, relationship discord, gap in prenatal care. Also has hx of SI/self harm, bulimia, and depression. Patient reports hx of bulimia and poor weight gain in pregnancy. Describes cycles of losing weight due to emesis/nausea, believes she has gained ~5 lbs in this pregnancy. Endorses poor diet. In interested in talking with nutrition about ways to increase calorie intake.   - SW consult  - Nutrition consult  -f/u EDS    #. FWB -   -Monitoring: Continuous, if painful contractions stop and FHR reassuring can transition to TID  -Growth (bedside US 10/07/20): EFW 922g, 5%ile. Plan for formal US  tomorrow  -Presentation: cephalic (7/12)  -Betamethasone: Yes (7/12-7/13/22); rescue eligible on NA  -Magnesium: Patient is a candidate for neuroprotection. (<32weeks).   -Desires full resuscitation? yes  -NICU consult: No, if c/f delivery will consult   -Genetics: none, suspect due to late presentation to [facility]    #. PNL - RH positive, Cultures and serologies reviewed, up to date and negative  -- 2 hour GTT neg  -- QFN neg/Chest X-ray N/A  --GBS, GC/CT, urine culture ordered    # PPx: Colace, SCDs     #. Dispo - In house until PTL stops or until delivery. Deliver for NRFHT, maternal or usual obstetrical indications.  -Active T+S: Yes (10/07/2020)  -Consented for Cesarean Section : No  -Desires BTL: No    To be discussed with attending physician, Dr. Marlynn Perking.  Note written in collaboration with Ivery Quale, MS4    Ivor Reining, MD  PGY2   Obstetrics & Gynecology  Pager [phone], [phone]
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
## 2020-10-07 NOTE — Interdisciplinary (Signed)
Taylor Zuniga is a 25 year old G1P0 at Unknown presenting to labor & delivery complaining of [place]'s q19min x 2 hrs, LOF and pelvic pressure.    Medical / OB History:   There is no problem list on file for this patient.      Chief Compliant:  Chief Complaint   Patient presents with   . R/O Preterm Labor       Uterine Contractions: [place]'s q89min x 2 hrs with 8/10 pain scale    Rupture of Membrane: undetermined at this time     Vaginal Bleeding: yes;  amount: Small  Spotting     Fetal Movement:Active    Previous C/S: no    Pre-eclampsia symptoms: no    Urinary tract infection symptoms: no      External fetal monitor and toco applied.   VS obtained.   Side rails up x2.   Call light in reach.   Will notify provider of pt arrival to L/D Triage.    Eric Form, RN,

## 2020-10-07 NOTE — Discharge Summary (Signed)
Antepartum Discharge Summary - Discharged Against Medical Advice    Date of admission: 10/07/2020  Date of Discharge: 10/07/20     Patient Name:  Taylor Zuniga     Principal Diagnosis (required):  Preterm contractions, discharged against medical advice    Hospital Problem List (required):  Patient Active Problem List   Diagnosis    Preterm labor in second trimester    Preterm labor in second trimester without delivery       Additional Hospital Diagnoses ("rule out" or "suspected" diagnoses, etc.):  R/o preterm labor    Principal Procedure During This Hospitalization (required):  Betamethasone administration x1    Other Procedures Performed During This Hospitalization (required):  None    Procedure results are available in Chart Review in Epic.  For those providers external to [place], the key procedure results are listed below:      Consultations Obtained During This Hospitalization:  None    Reason for Admission to the Hospital / History of Present Illness:  Summarized from admission H&P:   Taylor Zuniga [id number] is a 25 year old G1P0 at [redacted]w[redacted]d EGA who was BIBA for contractions. Pants were wet, though pt believes could be urine. Had CTX and spotting on 7/3 and was seen at [facility] where notes indicate her CL was 3.6cm, she received terbutaline x1, and then was discharged home. She appeared visibly uncomfortable in triage so her cervix was digitally checked by the RN.    Hospital Course by Problem (required):    #. Preterm contractions  Pt has multiple risk factors for PTL including African American race, low weight, low SES/housing instability, cannabis use. At [facility] SVE changed from closed to FT over 4 hours. Pt also reporting gush of fluid, likely urine as neg fern/pool/nitrazine. FFN unable to be collected due to prior digital exam. After transfer to [facility] pt reporting painful ctx q21min with toco showing q38min. SVE unchanged 0.5/50/-4 from 5 hours prior. Throughout her admission contractions decreased in severity and  intensity until feeling sporadic very mild contractions At this time, clinical picture not c/w PTL given normal TVCL, minimal cervical dilation. In the evening on day of admission pt demanding discharge as no longer feeling significant contractions. She was counseled extensively on the recommendation to continue inpatient stay due to recent preterm contractions.. Pt understood the risks of leaving against medical advice and elected to leave. An attempt was made to discuss administering BMZ #2 early however pt had left hospital prior being able to have this discussion.   -F/u urine culture, GC/CT, added to OB f/u list    #. Social Issues  #H/o bulimia, poor weight gain in pregnancy  #. THC use in pregnancy.   Pt recently moved from [place] with FOB. Reports unstable housing, relationship discord, gap in prenatal care. Also has hx of SI/self harm, bulimia, and depression. Patient reports hx of bulimia and poor weight gain in pregnancy. Describes cycles of losing weight due to emesis/nausea, believes she has gained ~5 lbs in this pregnancy. Endorses poor diet. In interested in talking with nutrition about ways to increase calorie intake. EDS 21. UDS pos THC.   -S/p SW consult  -Pt left prior to seeing nutritionist    #. Fetal well being: The baby was initially monitored continuously, then spaced to intermittent monitoring given reassuring fetal tracing. Betamethasone was given 7/12. Neonatology was consulted to counsel the patient.     Tests Outstanding at Discharge Requiring Follow Up:  urine culture, GC/CT  Discharge Condition (required):  Improved.    Key Physical Exam Findings at Discharge:  No significant physical examination findings at the time of discharge.    Discharge Diet:  Regular.    Discharge Medications:     What To Do With Your Medications      CONTINUE taking these medications      Add'l Info   multivitamin Tabs tablet  Take 1 tablet by mouth daily.   Refills: 0            Allergies:  No  Known Allergies    Discharge Disposition:  Home.    Discharge Code Status:  Full code / full care  This code status is not changed from the time of admission.    Follow Up Appointments:   Appointment advised. Patient will call to schedule.    Scheduled appointments:  No future appointments.   Pt seen at [facility] for prenatal care.     For appointments requested for after discharge that have not yet been scheduled, refer to the Post Discharge Referrals section of the After Visit Summary.    Discharging Physician's Contact Information:  [facility] operator at [phone].    Note written in collaboration with Ivery Quale, MS4    Ivor Reining, MD  PGY2   Obstetrics & Gynecology  Pager [phone], [phone]

## 2020-10-07 NOTE — Interdisciplinary (Addendum)
Pt AOx4. All questions answered. Pt deciding to leave AMA, Dr Tyna Jaksch notified. Pt and RN signed AMA signature page, which was placed in pts chart  Pt's IV was removed and pressure bandage applied. Pt in NAD. Drawers and closets checked for pt belongings. pt ambulated to lobby with father of baby at 71.

## 2020-10-07 NOTE — Interdisciplinary (Signed)
10/07/20 1450   Assessment   Assessment Type Initial;Face to Face   Referral Information   Referral Type Homeless;Community Resources/Referrals   Social Assessment   Where was the patient admitted from? * Home   Prior to Level of Function * Ambulatory/Independent with ADL's   Primary Caretaker(s) * Self   Primary Contact Name, Number and Relationship * Taylor Gash "[name]" does not have cell phones   Permission to [employer] * Yes   Social Determinates of [place]   A List of Tax adviser Provided Yes   Teacher, adult education Provided No - Environmental consultant Referral Patient Declined   Primary Care Provider Contacted (Behavioral Health Only) Patient Declined   Available Assistance/Support System * Spouse / significant other   Has discharge transport been arranged? No   Patient/Family/Other Are In Agreement With Discharge Plan * To be determined   Involvement with Law None   Income Information   Income Source Music therapist counseling   Mental Health Assessment   Past Mental Health Issues hx of depression   Mental Status Anxiety   Behavioral Assessment No issues   Physical Assessment No issues   Mental Status - Orientation AOX4   Have you experienced any neglect or abuse in the past? No   Notify Treatment Team to Assess Patient's Capacity? No concerns at this time   Adjustment to Illness   Patient's Adjustment Acceptance   Family's Adjustment Acceptance   Substance Abuse History (CAGE-AID)   Substance Abuse History denies   Plans/Interventions/Discharge   Plan/Interventions Explore needs and options for aftercare, provide referrals   Anticipated Discharge Destination Unable to determine at this time   Discharge Resources Given financial counseling for insurance Administrator Counselor referral), homeless program referral   Barriers to Discharge * Homeless;Await imaging   Discharge Information for Patient - THIS  GROUP FILES TO THE PATIENT'S AVS   Information from Social Work financial counseling for insurance Administrator Counselor referral), homeless program referral   Social Worker Name and Phone Number: Taylor Zuniga, Marlinda Mike [phone]   _________________________________________  SOCIAL WORK CONSULT    SW responding to [place] for homeless assessment / community resources.   SW met with Taylor Zuniga at bedside and introduced self/role. RN Pierre Bali was present and monitoring baby.  Taylor Zuniga was not present at first, but joined the conversation partway through.        Living situation/Support/Employment: Taylor Zuniga and Taylor Zuniga were living in [place] up until mid-May; they moved to [place], as Taylor Zuniga's family (parents and siblings) live in / around [place].  Taylor Zuniga confirms she is homeless and has limited supports, as Taylor Zuniga's family is not supportive and not willing to help her and Taylor Zuniga.   Taylor Zuniga reports she is attempting to mend her family relations with her parents in [place], but most likely to stay in [place] throughout her pregnancy.    Taylor Zuniga expressed financial concerns as she is not currently working and has been experiencing concerns about the pregnancy.  Taylor Zuniga is currently working and/or in the process of trying to get another job; a major challenge is their lack of finances and transportation.   Parents do not receive any supplemental supports such as food stamps or WIC.  SW discussed several community resources such as [employer], WIC,  And homeless programs such as [employer], all of which Taylor Zuniga was receptive to. Parents do not  have a car seat, crib and any other necessary supplies.       History of Substance Abuse/Mental Health Issues: . Taylor Zuniga denies past or current substance abuse history; smoked cigarettes "on occasion," but quit when she know she was pregnant.   Taylor Zuniga states a history of depression; SW discussed EDS score of 21 last taken today (7.12.22).  Taylor Zuniga states she is the "most stressed I've  ever been," considering the fear of being in labor and either delivering or losing the baby.  States she is feeling much better now that she can be off the baby monitor, as that assures her the baby is okay.  Taylor Zuniga denies all substance use and denies any mental health.      Assessment: Taylor Zuniga appeared AA&Ox4, engaged, and receptive to SW visit. Taylor Zuniga was forthcoming with her homeless situation and about her financial and personal struggles with homelessness and family relations.  Taylor Zuniga also discussed stressors and concerns regarding her future- financial and physical health as it pertains to her and her baby.  SW, Taylor Zuniga and Taylor Zuniga  engaged in a meaningful conversation about their needs and willingness to actively participate in a homeless program.  SW educated them about homeless shelters vs homeless programs.  Taylor Zuniga explained that he is trying to mend relations with his family here; they are willing to help him in certain areas, but are hesitant to allow him and Taylor Zuniga to live with them.      SW Interventions:  1. SW assessed hx of mental health and substance use; assessed willingness to participate in homeless programs. Both are willing to stay sober and are willing to participate in parenting classes and/ or other self- improvement programs.    2. SW discussed how Taylor Zuniga is coping; coping within normal limits, given situation.    3. SW provided community mental health resources including handout on [place] [phone] and contact information for [facility],  4. Addressed EDS scores and provided education on how to self-monitor and identify signs and symptoms of depression.  5. Provided clarification of social worker's role in hospital and how to contact SW  6. SW provided supportive counseling related to life with baby; SW listened attentively, validated feelings, acknowledged challenges and concerns.  7. SW called Development worker, community, Verne Carrow. to discuss financial  resources/ insurance options since Taylor Zuniga is going to be staying in [place]; she will be meeting with the pt in the a.m.   8. SW printed up and reviewed the eligibility criterion for [facility] [place], [facility], [facility], other community resources to review.       SW will remain available to assist.
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
## 2020-10-08 LAB — CHLAMYDIA/GONORRHEA PCR, GENITAL
Chlamydia trachomatis PCR: NOT DETECTED
Neisseria gonorrhoeae PCR: NOT DETECTED

## 2020-10-08 LAB — GROUP B STREPTOCOCCUS NUCLEIC ACID DETECTION SCREEN: Group B Streptococcus Nucleic Acid Detection Screen: NOT DETECTED

## 2020-10-08 LAB — CHLAMYDIA/GC PCR-GENITAL

## 2020-10-08 LAB — URINE CULTURE: Urine Culture Result: NO GROWTH

## 2020-10-21 ENCOUNTER — Inpatient Hospital Stay
Admission: AD | Admit: 2020-10-21 | Discharge: 2020-10-22 | DRG: 832 | Disposition: A | Discharge status: Home / Self Care | Payer: MEDICAID | Attending: Gynecology | Admitting: Gynecology

## 2020-10-21 ENCOUNTER — Observation Stay
Admission: AD | Admit: 2020-10-21 | Discharge: 2020-10-21 | Discharge status: Against Medical Advice | Payer: MEDICAID | Attending: Obstetrics & Gynecology | Admitting: Obstetrics & Gynecology

## 2020-10-21 DIAGNOSIS — O0930 Supervision of pregnancy with insufficient antenatal care, unspecified trimester: Secondary | ICD-10-CM

## 2020-10-21 DIAGNOSIS — R87618 Other abnormal cytological findings on specimens from cervix uteri: Secondary | ICD-10-CM | POA: Insufficient documentation

## 2020-10-21 DIAGNOSIS — O4700 False labor before 37 completed weeks of gestation, unspecified trimester: Secondary | ICD-10-CM | POA: Diagnosis present

## 2020-10-21 DIAGNOSIS — Z5329 Procedure and treatment not carried out because of patient's decision for other reasons: Secondary | ICD-10-CM | POA: Insufficient documentation

## 2020-10-21 DIAGNOSIS — Z20822 Contact with and (suspected) exposure to covid-19: Secondary | ICD-10-CM | POA: Insufficient documentation

## 2020-10-21 DIAGNOSIS — O4703 False labor before 37 completed weeks of gestation, third trimester: Principal | ICD-10-CM | POA: Insufficient documentation

## 2020-10-21 DIAGNOSIS — Z3A29 29 weeks gestation of pregnancy: Secondary | ICD-10-CM | POA: Insufficient documentation

## 2020-10-21 DIAGNOSIS — O0933 Supervision of pregnancy with insufficient antenatal care, third trimester: Secondary | ICD-10-CM | POA: Insufficient documentation

## 2020-10-21 DIAGNOSIS — Z8659 Personal history of other mental and behavioral disorders: Secondary | ICD-10-CM | POA: Insufficient documentation

## 2020-10-21 DIAGNOSIS — Z3A30 30 weeks gestation of pregnancy: Secondary | ICD-10-CM

## 2020-10-21 DIAGNOSIS — Z91411 Personal history of adult psychological abuse: Secondary | ICD-10-CM

## 2020-10-21 DIAGNOSIS — Z630 Problems in relationship with spouse or partner: Secondary | ICD-10-CM

## 2020-10-21 DIAGNOSIS — Z59819 Housing instability, housed unspecified: Secondary | ICD-10-CM

## 2020-10-21 DIAGNOSIS — O26873 Cervical shortening, third trimester: Secondary | ICD-10-CM | POA: Diagnosis present

## 2020-10-21 DIAGNOSIS — O99891 Other specified diseases and conditions complicating pregnancy: Secondary | ICD-10-CM | POA: Insufficient documentation

## 2020-10-21 DIAGNOSIS — F32A Depression, unspecified: Secondary | ICD-10-CM

## 2020-10-21 DIAGNOSIS — O365930 Maternal care for other known or suspected poor fetal growth, third trimester, not applicable or unspecified: Secondary | ICD-10-CM

## 2020-10-21 DIAGNOSIS — Z91410 Personal history of adult physical and sexual abuse: Secondary | ICD-10-CM

## 2020-10-21 DIAGNOSIS — J45909 Unspecified asthma, uncomplicated: Secondary | ICD-10-CM | POA: Insufficient documentation

## 2020-10-21 DIAGNOSIS — O99513 Diseases of the respiratory system complicating pregnancy, third trimester: Secondary | ICD-10-CM | POA: Insufficient documentation

## 2020-10-21 DIAGNOSIS — Z9151 Personal history of suicidal behavior: Secondary | ICD-10-CM

## 2020-10-21 DIAGNOSIS — Z79899 Other long term (current) drug therapy: Secondary | ICD-10-CM | POA: Insufficient documentation

## 2020-10-21 DIAGNOSIS — Z5989 Other problems related to housing and economic circumstances: Secondary | ICD-10-CM | POA: Insufficient documentation

## 2020-10-21 LAB — CBC WITH DIFF, BLOOD
ANC-Automated: 7.9 10*3/uL — ABNORMAL HIGH (ref 1.6–7.0)
Abs Basophils: 0 10*3/uL (ref <0.2)
Abs Eosinophils: 0.1 10*3/uL (ref 0.0–0.5)
Abs Lymphs: 2.5 10*3/uL (ref 0.8–3.1)
Abs Monos: 0.6 10*3/uL (ref 0.2–0.8)
Basophils: 0 %
Eosinophils: 1 %
Hct: 33.7 % — ABNORMAL LOW (ref 34.0–45.0)
Hgb: 11.3 gm/dL (ref 11.2–15.7)
Imm Gran %: 1 % — ABNORMAL HIGH (ref <1)
Imm Gran Abs: 0.1 10*3/uL — ABNORMAL HIGH (ref <0.1)
Lymphocytes: 22 %
MCH: 28.3 pg (ref 26.0–32.0)
MCHC: 33.5 g/dL (ref 32.0–36.0)
MCV: 84.3 um3 (ref 79.0–95.0)
MPV: 10.9 fL (ref 9.4–12.4)
Monocytes: 5 %
Plt Count: 178 10*3/uL (ref 140–370)
RBC: 4 10*6/uL (ref 3.90–5.20)
RDW: 12.8 % (ref 12.0–14.0)
Segs: 71 %
WBC: 11.2 10*3/uL — ABNORMAL HIGH (ref 4.0–10.0)

## 2020-10-21 LAB — URINALYSIS
Bilirubin: NEGATIVE
Blood: NEGATIVE
Glucose: NEGATIVE
Ketones: NEGATIVE
Leuk Esterase: 500 Leu/uL — AB
Nitrite: NEGATIVE
Protein: NEGATIVE
Specific Gravity: 1.007 (ref 1.002–1.030)
Urobilinogen: NEGATIVE
pH: 6.5 (ref 5.0–8.0)

## 2020-10-21 LAB — VAGINOSIS SCREEN
Candida, Vaginal Screen: NEGATIVE
Gardnerella, Vaginal Screen: NEGATIVE
Trichomonas, Vaginal Screen: NEGATIVE

## 2020-10-21 LAB — INFLUENZA A/B & SARS-COV-2 PCR COMBO FOR RAPID RESPONSE LAB
Influenza A PCR, RRL: NOT DETECTED
Influenza B PCR, RRL: NOT DETECTED
SARS-CoV-2 PCR, RRL: NOT DETECTED

## 2020-10-21 LAB — TYPE & SCREEN
ABO/RH: B POS
Antibody Screen: NEGATIVE

## 2020-10-21 LAB — GLUCOSE, 1HR POST DOSE BLOOD: Glu, 1Hr Post Dose: 108 mg/dL

## 2020-10-21 MED ORDER — SIMETHICONE 80 MG OR CHEW
80.0000 mg | CHEWABLE_TABLET | Freq: Four times a day (QID) | ORAL | Status: DC | PRN
Start: 2020-10-21 — End: 2020-10-21

## 2020-10-21 MED ORDER — ACETAMINOPHEN 325 MG PO TABS
650.0000 mg | ORAL_TABLET | Freq: Four times a day (QID) | ORAL | Status: DC | PRN
Start: 2020-10-21 — End: 2020-10-22

## 2020-10-21 MED ORDER — OXYTOCIN INFUSION 40 UNITS/1000 ML NS (PREMIX)
Freq: Once | Status: DC | PRN
Start: 2020-10-21 — End: 2020-10-22

## 2020-10-21 MED ORDER — MISOPROSTOL 200 MCG OR TABS
800.0000 ug | ORAL_TABLET | Freq: Once | ORAL | Status: DC | PRN
Start: 2020-10-21 — End: 2020-10-21

## 2020-10-21 MED ORDER — MAGNESIUM SULFATE 10% IN NS 130 ML (PREMADE)
2.0000 g/h | Status: DC
Start: 2020-10-21 — End: 2020-10-21

## 2020-10-21 MED ORDER — MULTI-VITAMINS PO TABS
1.0000 | ORAL_TABLET | Freq: Every day | ORAL | Status: DC
Start: 2020-10-21 — End: 2020-10-21

## 2020-10-21 MED ORDER — SIMETHICONE 80 MG OR CHEW
80.0000 mg | CHEWABLE_TABLET | Freq: Four times a day (QID) | ORAL | Status: DC | PRN
Start: 2020-10-21 — End: 2020-10-22

## 2020-10-21 MED ORDER — SODIUM CHLORIDE 0.9 % IJ SOLN (CUSTOM)
3.0000 mL | INTRAMUSCULAR | Status: DC | PRN
Start: 2020-10-21 — End: 2020-10-22

## 2020-10-21 MED ORDER — MAGNESIUM SULFATE 10% IN NS 130 ML (PREMADE)
4.0000 g | Freq: Once | Status: DC
Start: 2020-10-21 — End: 2020-10-21
  Filled 2020-10-21: qty 130

## 2020-10-21 MED ORDER — OXYTOCIN INFUSION 20 UNITS/1000 ML LR (PREMIX)
Status: DC | PRN
Start: 2020-10-21 — End: 2020-10-22

## 2020-10-21 MED ORDER — SODIUM CHLORIDE 0.9% TKO INFUSION
INTRAVENOUS | Status: DC | PRN
Start: 2020-10-21 — End: 2020-10-22

## 2020-10-21 MED ORDER — ACETAMINOPHEN 325 MG PO TABS
650.0000 mg | ORAL_TABLET | Freq: Four times a day (QID) | ORAL | Status: DC | PRN
Start: 2020-10-21 — End: 2020-10-21

## 2020-10-21 MED ORDER — LIDOCAINE HCL (PF) 1 % IJ SOLN
20.0000 mL | Freq: Once | INTRAMUSCULAR | Status: DC | PRN
Start: 2020-10-21 — End: 2020-10-21

## 2020-10-21 MED ORDER — DIPHENOXYLATE-ATROPINE 2.5-0.025 MG OR TABS
1.0000 | ORAL_TABLET | Freq: Once | ORAL | Status: DC | PRN
Start: 2020-10-21 — End: 2020-10-22

## 2020-10-21 MED ORDER — SODIUM CHLORIDE 0.9 % IJ SOLN (CUSTOM)
3.0000 mL | INTRAMUSCULAR | Status: DC | PRN
Start: 2020-10-21 — End: 2020-10-21

## 2020-10-21 MED ORDER — INDOMETHACIN 25 MG OR CAPS
25.0000 mg | ORAL_CAPSULE | Freq: Four times a day (QID) | ORAL | Status: DC
Start: 2020-10-21 — End: 2020-10-21

## 2020-10-21 MED ORDER — TERBUTALINE SULFATE 1 MG/ML IJ SOLN
0.2500 mg | INTRAMUSCULAR | Status: DC | PRN
Start: 2020-10-21 — End: 2020-10-21

## 2020-10-21 MED ORDER — TRANEXAMIC ACID IN NACL 1000 MG/100ML IV SOLN
1000.0000 mg | Freq: Once | INTRAVENOUS | Status: DC | PRN
Start: 2020-10-21 — End: 2020-10-21

## 2020-10-21 MED ORDER — TERBUTALINE SULFATE 1 MG/ML IJ SOLN
0.2500 mg | INTRAMUSCULAR | Status: DC | PRN
Start: 2020-10-21 — End: 2020-10-22

## 2020-10-21 MED ORDER — MAGNESIUM SULFATE 10% IN NS 130 ML (PREMADE)
2.0000 g/h | Status: DC
Start: 2020-10-21 — End: 2020-10-22
  Filled 2020-10-21: qty 130

## 2020-10-21 MED ORDER — VITAFOL-OB OR TABS
1.0000 | ORAL_TABLET | Freq: Every day | ORAL | Status: DC
Start: 2020-10-21 — End: 2020-10-21
  Administered 2020-10-21: 1 via ORAL
  Filled 2020-10-21: qty 1

## 2020-10-21 MED ORDER — OXYTOCIN INFUSION 40 UNITS/1000 ML NS (PREMIX)
Freq: Once | Status: DC | PRN
Start: 2020-10-21 — End: 2020-10-21

## 2020-10-21 MED ORDER — ONDANSETRON HCL 4 MG/2ML IV SOLN
4.0000 mg | Freq: Four times a day (QID) | INTRAMUSCULAR | Status: DC | PRN
Start: 2020-10-21 — End: 2020-10-22

## 2020-10-21 MED ORDER — OXYTOCIN 10 UNIT/ML IJ SOLN
10.0000 [IU] | Freq: Once | INTRAMUSCULAR | Status: DC | PRN
Start: 2020-10-21 — End: 2020-10-22

## 2020-10-21 MED ORDER — DOCUSATE SODIUM 250 MG OR CAPS
250.0000 mg | ORAL_CAPSULE | Freq: Two times a day (BID) | ORAL | Status: DC
Start: 2020-10-21 — End: 2020-10-21
  Administered 2020-10-21: 250 mg via ORAL
  Filled 2020-10-21: qty 1

## 2020-10-21 MED ORDER — CALCIUM CARBONATE ANTACID 500 MG OR CHEW
500.0000 mg | CHEWABLE_TABLET | Freq: Four times a day (QID) | ORAL | Status: DC | PRN
Start: 2020-10-21 — End: 2020-10-21

## 2020-10-21 MED ORDER — LIDOCAINE HCL 1 % IJ SOLN
1.0000 mL | Freq: Once | INTRAMUSCULAR | Status: DC | PRN
Start: 2020-10-21 — End: 2020-10-21

## 2020-10-21 MED ORDER — BETAMETHASONE ACET & SOD PHOS 6 (3-3) MG/ML IJ SUSP
12.0000 mg | INTRAMUSCULAR | Status: DC
Start: 2020-10-21 — End: 2020-10-21
  Administered 2020-10-21: 12 mg via INTRAMUSCULAR
  Filled 2020-10-21: qty 2

## 2020-10-21 MED ORDER — LIDOCAINE HCL (PF) 1 % IJ SOLN
20.0000 mL | Freq: Once | INTRAMUSCULAR | Status: DC | PRN
Start: 2020-10-21 — End: 2020-10-22

## 2020-10-21 MED ORDER — CARBOPROST TROMETHAMINE 250 MCG/ML IM SOLN
250.0000 ug | Freq: Once | INTRAMUSCULAR | Status: DC | PRN
Start: 2020-10-21 — End: 2020-10-22

## 2020-10-21 MED ORDER — SODIUM CHLORIDE 0.9 % IJ SOLN (CUSTOM)
3.0000 mL | Freq: Three times a day (TID) | INTRAMUSCULAR | Status: DC
Start: 2020-10-21 — End: 2020-10-22
  Administered 2020-10-21 – 2020-10-22 (×3): 3 mL via INTRAVENOUS

## 2020-10-21 MED ORDER — METHYLERGONOVINE MALEATE 0.2 MG/ML IJ SOLN
0.2000 mg | Freq: Once | INTRAMUSCULAR | Status: DC | PRN
Start: 2020-10-21 — End: 2020-10-22

## 2020-10-21 MED ORDER — BETAMETHASONE ACET & SOD PHOS 6 (3-3) MG/ML IJ SUSP
12.0000 mg | INTRAMUSCULAR | Status: AC
Start: 2020-10-22 — End: 2020-10-22
  Administered 2020-10-22: 12 mg via INTRAMUSCULAR
  Filled 2020-10-21: qty 2

## 2020-10-21 MED ORDER — SODIUM CHLORIDE 0.9 % IJ SOLN (CUSTOM)
3.0000 mL | Freq: Three times a day (TID) | INTRAMUSCULAR | Status: DC
Start: 2020-10-21 — End: 2020-10-21

## 2020-10-21 MED ORDER — VITAFOL-OB OR TABS
1.0000 | ORAL_TABLET | Freq: Every day | ORAL | Status: DC
Start: 2020-10-22 — End: 2020-10-22
  Administered 2020-10-22: 1 via ORAL
  Filled 2020-10-21: qty 1

## 2020-10-21 MED ORDER — SOD CITRATE-CITRIC ACID 500-334 MG/5ML OR SOLN
30.0000 mL | Freq: Once | ORAL | Status: DC | PRN
Start: 2020-10-21 — End: 2020-10-22

## 2020-10-21 MED ORDER — OXYTOCIN 10 UNIT/ML IJ SOLN
10.0000 [IU] | Freq: Once | INTRAMUSCULAR | Status: DC | PRN
Start: 2020-10-21 — End: 2020-10-21

## 2020-10-21 MED ORDER — TRANEXAMIC ACID IN NACL 1000 MG/100ML IV SOLN
1000.0000 mg | Freq: Once | INTRAVENOUS | Status: DC | PRN
Start: 2020-10-21 — End: 2020-10-22

## 2020-10-21 MED ORDER — METHYLERGONOVINE MALEATE 0.2 MG/ML IJ SOLN
0.2000 mg | Freq: Once | INTRAMUSCULAR | Status: DC | PRN
Start: 2020-10-21 — End: 2020-10-21

## 2020-10-21 MED ORDER — SOD CITRATE-CITRIC ACID 500-334 MG/5ML OR SOLN
30.0000 mL | Freq: Once | ORAL | Status: DC | PRN
Start: 2020-10-21 — End: 2020-10-21

## 2020-10-21 MED ORDER — DOCUSATE SODIUM 250 MG OR CAPS
250.0000 mg | ORAL_CAPSULE | Freq: Two times a day (BID) | ORAL | Status: DC
Start: 2020-10-21 — End: 2020-10-22
  Administered 2020-10-21 – 2020-10-22 (×2): 250 mg via ORAL
  Filled 2020-10-21 (×2): qty 1

## 2020-10-21 MED ORDER — INDOMETHACIN 25 MG OR CAPS
25.0000 mg | ORAL_CAPSULE | Freq: Four times a day (QID) | ORAL | Status: DC
Start: 2020-10-21 — End: 2020-10-22
  Administered 2020-10-21 – 2020-10-22 (×2): 25 mg via ORAL
  Filled 2020-10-21 (×2): qty 1

## 2020-10-21 MED ORDER — LIDOCAINE HCL 1 % IJ SOLN
1.0000 mL | Freq: Once | INTRAMUSCULAR | Status: DC | PRN
Start: 2020-10-21 — End: 2020-10-22

## 2020-10-21 MED ORDER — MISOPROSTOL 200 MCG OR TABS
800.0000 ug | ORAL_TABLET | Freq: Once | ORAL | Status: DC | PRN
Start: 2020-10-21 — End: 2020-10-22

## 2020-10-21 MED ORDER — SODIUM CHLORIDE 0.9% TKO INFUSION
INTRAVENOUS | Status: DC | PRN
Start: 2020-10-21 — End: 2020-10-21

## 2020-10-21 MED ORDER — ONDANSETRON HCL 4 MG/2ML IV SOLN
4.0000 mg | Freq: Four times a day (QID) | INTRAMUSCULAR | Status: DC | PRN
Start: 2020-10-21 — End: 2020-10-21

## 2020-10-21 MED ORDER — OXYTOCIN INFUSION 20 UNITS/1000 ML LR (PREMIX)
Status: DC | PRN
Start: 2020-10-21 — End: 2020-10-21

## 2020-10-21 MED ORDER — INDOMETHACIN 25 MG OR CAPS
50.0000 mg | ORAL_CAPSULE | Freq: Once | ORAL | Status: AC
Start: 2020-10-21 — End: 2020-10-21
  Administered 2020-10-21: 50 mg via ORAL
  Filled 2020-10-21: qty 2

## 2020-10-21 MED ORDER — CALCIUM CARBONATE ANTACID 500 MG OR CHEW
500.0000 mg | CHEWABLE_TABLET | Freq: Four times a day (QID) | ORAL | Status: DC | PRN
Start: 2020-10-21 — End: 2020-10-22
  Administered 2020-10-21 (×2): 500 mg via ORAL
  Filled 2020-10-21: qty 1

## 2020-10-21 MED ORDER — CARBOPROST TROMETHAMINE 250 MCG/ML IM SOLN
250.0000 ug | Freq: Once | INTRAMUSCULAR | Status: DC | PRN
Start: 2020-10-21 — End: 2020-10-21

## 2020-10-21 MED ORDER — DIPHENOXYLATE-ATROPINE 2.5-0.025 MG OR TABS
1.0000 | ORAL_TABLET | Freq: Once | ORAL | Status: DC | PRN
Start: 2020-10-21 — End: 2020-10-21

## 2020-10-21 NOTE — Interdisciplinary (Signed)
Pt signed AMA paperwork. Counseled by DR. Lorella Nimrod
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
## 2020-10-21 NOTE — Discharge Instructions (Signed)
Obstetrical Discharge Instructions:        [facility] Labor & Delivery's Phone #: [phone]  Clinic Phone #: [phone]  Emergency #: [phone]                       Based on the medical evaluation completed by our staff, it has been determined that you are NOT IN NEED OF EMERGENCY OBSTETRICAL SERVICES AT THIS TIME.     If and when any of the symptoms noted below occur, you are advised to go to the hospital closest to your home that provides obstetrical services or to the hospital that you and your health care provider have agreed upon.    Please follow the instructions for:    FETAL KICK COUNTS    Count the baby's movement every night.  A movement may be a kick, swish or roll. Do not count hiccups or small flutters.  Count baby's movements while lying down, preferably on your left side,  preferably after a meal.  Mark down the time you feel the baby move for the first time.  Mark down the time you feel the tenth fetal movement.  You should feel at least 10 fetal movements within one hour.  Call Labor and Delivery immediately if:  You do not feel 10 movements within one hour  It takes longer and longer for your baby to move 10 times  You have not felt your baby move all day.        PRETERM LABOR PRECAUTIONS    Regular uterine tightening  Low, dull backache.  Menstrual cramps  Pressure in your pelvis  Leaking or gushing fluid from your vagina.  Changes in vaginal discharge; watery, bloody or mucousy.        PREGNANCY INDUCED HYPERTENSION    Headache: sudden or severe.  Visual problems: blurred or double vision or "seeing spots"  Pain under your ribs, right over your stomach  Swelling of your face and hands  Swelling of your ankles or feet after 12-hour rest  Decrease in the amount of your urine  Rapid weight gain: 4 or 5 pounds in one week.    No future appointments.

## 2020-10-21 NOTE — Interdisciplinary (Signed)
Taylor Zuniga is a 25 year old G2P0010 at [redacted]w[redacted]d presenting to labor & delivery complaining of painful contractions and lower back pain that started 3 days ago.     Medical / OB History:   Patient Active Problem List   Diagnosis   . Preterm labor in second trimester   . Preterm labor in second trimester without delivery       Chief Compliant:  Chief Complaint   Patient presents with   . Contractions       Uterine Contractions: Pt reports feeling contractions the last 3 days but some are more frequent than others so they are somewhat irregular and some are more painful than others.     Rupture of Membrane: no    Vaginal Bleeding: no    Fetal Movement:Active    Previous C/S: no    Pre-eclampsia symptoms: no    Urinary tract infection symptoms: no      External fetal monitor and toco applied.   VS obtained.   Side rails up x2.   Call light in reach.   Will notify provider of pt arrival to L/D Triage.    Youlanda Mighty, RN,

## 2020-10-22 ENCOUNTER — Ambulatory Visit (HOSPITAL_BASED_OUTPATIENT_CLINIC_OR_DEPARTMENT_OTHER): Payer: MEDICAID

## 2020-10-22 DIAGNOSIS — Z3A27 27 weeks gestation of pregnancy: Secondary | ICD-10-CM

## 2020-10-22 DIAGNOSIS — Z3A29 29 weeks gestation of pregnancy: Secondary | ICD-10-CM

## 2020-10-22 DIAGNOSIS — O09293 Supervision of pregnancy with other poor reproductive or obstetric history, third trimester: Secondary | ICD-10-CM

## 2020-10-22 DIAGNOSIS — O26873 Cervical shortening, third trimester: Secondary | ICD-10-CM

## 2020-10-22 DIAGNOSIS — Z3A30 30 weeks gestation of pregnancy: Secondary | ICD-10-CM

## 2020-10-22 DIAGNOSIS — O365930 Maternal care for other known or suspected poor fetal growth, third trimester, not applicable or unspecified: Secondary | ICD-10-CM

## 2020-10-22 LAB — MAGNESIUM, BLOOD: Magnesium: 5.1 mg/dL (ref 1.6–2.6)

## 2020-10-22 LAB — GROUP B STREPTOCOCCUS NUCLEIC ACID DETECTION SCREEN: Group B Streptococcus Nucleic Acid Detection Screen: NOT DETECTED

## 2020-10-22 MED ORDER — LACTATED RINGERS IV SOLN
INTRAVENOUS | Status: DC
Start: 2020-10-22 — End: 2020-10-22

## 2020-10-22 MED ORDER — MAGNESIUM SULFATE 10% IN NS 130 ML (PREMADE)
2.0000 g/h | Status: DC
Start: 2020-10-22 — End: 2020-10-22
  Administered 2020-10-22 (×2): 2 g/h via INTRAVENOUS
  Filled 2020-10-22: qty 130

## 2020-10-22 MED ORDER — MAGNESIUM SULFATE 10% IN NS 130 ML (PREMADE)
4.0000 g | Freq: Once | Status: AC
Start: 2020-10-22 — End: 2020-10-22
  Administered 2020-10-22: 4 g via INTRAVENOUS
  Filled 2020-10-22: qty 130
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
## 2020-10-22 NOTE — Interdisciplinary (Signed)
1500--Written and verbal discharge  instructions provided and explained to patient at bedside Patient sent home with number for Labor & Delivery to call with any further questions/concerns.All questions answered at this time and patient verbalized understanding,  Patient discharged home ambulatory at this time accompany with significant other.     1530-- Wheeled out of the unit at this time accompany by significant other and A. Jon Billings, surgical tech.
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
## 2020-10-22 NOTE — Discharge Instructions (Signed)
Diagnosis and Reason for Admission    You were admitted to the hospital for the following reason(s):  Preterm contractions    Your full diagnosis list is located on this After Visit Summary in the Hospital Problems section.    Patient Active Problem List   Diagnosis    Preterm labor in second trimester    Preterm labor in second trimester without delivery    Preterm contractions       What Happened During Your Hospital Stay    The main tests and treatments done for you during this hospitalization were:    Steroid shorts to help your baby's lungs mature  Anatomy ultrasound for your baby    The following evaluation is still important to complete after discharge from the hospital:  Follow up ultrasound in 2 weeks to see your baby's growth  Follow up next week for a clinic visit for a non-stress test to monitor your baby    We have messaged the clinic to help get you this follow up, but if you do not hear from them by the end of the week please call [phone] to get an appointment    Instructions for After Discharge    Your diet at home should be regular diet    Your activity level at home should be: Normal pregnancy activity.     Your medication list is located on this After Visit Summary in the Current Discharge Medication List section.  Your nurse will review this information with you before you leave the hospital.    It is very important for you to keep a current medication list with you in order to assist your doctors with your medical care.  Bring this After Visit Summary with you to your follow up appointments.      For any concerning symptoms:  Labor & Deliverys Phone #: [phone]        If and when any of the symptoms noted below occur, you are advised to go to the hospital closest to your home that provides obstetrical services or to the hospital that you and your health care provider have agreed upon.    Please follow the instructions for:    FETAL KICK COUNTS (after 28 weeks)  Count the babys  movement every night.  A movement may be a kick, swish or roll. Do not count hiccups or small flutters.  Count babys movements while lying down, preferably on your left side,  preferably after a meal.  Mark down the time you feel the baby move for the first time.  Mark down the time you feel the tenth fetal movement.  You should feel at least 10 fetal movements within one hour.  Call Labor and Delivery immediately if:  You do not feel 10 movements within one hour  It takes longer and longer for your baby to move 10 times  You have not felt your baby move all day.      PREGNANCY INDUCED HYPERTENSION  Headache: sudden or severe.  Visual problems: blurred or double vision or seeing spots  Pain under your ribs, right over your stomach  Swelling of your face and hands  Swelling of your ankles or feet after 12-hour rest  Decrease in the amount of your urine  Rapid weight gain: 4 or 5 pounds in one week.      If you have any questions about your hospital care, your medications, or if you have new or concerning symptoms soon after going home from  the hospital, and you need to contact your hospital physician, your hospital physician can be contacted in the following manner:  [facility] operator at [phone].    What Needs to Happen Next After Discharge -- Appointments and Follow Up    Any appointments already scheduled at [place] clinics will be listed in the Future Appointments section at the top of this After Visit Summary.  Any appointments that have been requested, but have not yet been scheduled, will be listed below that under Post Discharge Referrals.

## 2020-10-22 NOTE — Interdisciplinary (Addendum)
Fetal strip has been reviewed by MD. Patient declined vaginal bleeding, leaking of fluid, and abdominal cramping.  EFM/TOCO removed at this time per MD ordered.
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
## 2020-10-23 ENCOUNTER — Telehealth (HOSPITAL_BASED_OUTPATIENT_CLINIC_OR_DEPARTMENT_OTHER): Payer: Self-pay

## 2020-10-23 LAB — CHLAMYDIA/GONORRHEA PCR, GENITAL
Chlamydia trachomatis PCR: UNDETERMINED
Neisseria gonorrhoeae PCR: UNDETERMINED

## 2020-10-23 LAB — CHLAMYDIA/GC PCR-GENITAL

## 2020-10-23 NOTE — Telephone Encounter (Addendum)
Transfer from ED, left AMA    G2P0  GA: [redacted]w[redacted]d  EDD 12/31/20    HROB indication: Short cervix and preterm contractions, treated with betamethasone.   FGR found on Anatomy US during admission, 7/27 EFW 1226g, 5%ile. Normal umbilical artery dopplers.    Recommend follow-up growth evaluation and doppler studies in two weeks biweekly NST    Recent ER visit at [facility] on 7/12    RN reached out to schedule pt in HROB post ER visit per Dr Kalman Jewels.   VM is not setup, unable to leave msg. RN reattempted later and left CB# with person who answered in her listed #. Reattemped and did not reach.

## 2020-10-27 ENCOUNTER — Telehealth (HOSPITAL_BASED_OUTPATIENT_CLINIC_OR_DEPARTMENT_OTHER): Payer: Self-pay

## 2020-10-27 NOTE — Telephone Encounter (Signed)
Reattempted pt on number given to schedule NST appts, VM was not set up - unable to leave a msg.

## 2020-10-27 NOTE — Telephone Encounter (Addendum)
Attempted pt on phone # given and family member verified her identity asking if this was Taylor Zuniga for her FU and stated they would contact patient who has no cell phone and bring her to appt. Declined a slot tomorrow and said TH would work. RN explained more needs to get scheduled without providing details. All appt info given and family vu. If patient calls, please schedule NST asap and a 2 week FU HROB.

## 2020-10-29 NOTE — Telephone Encounter (Signed)
RN called number listed to confirm patient's appt tomorrow. Grandmother of baby who has been patient's main contact to schedule verified pt's identity and explained pt is leaving the state, won't be attending appt tomorrow. Slot cancelled.

## 2020-10-30 ENCOUNTER — Ambulatory Visit (HOSPITAL_BASED_OUTPATIENT_CLINIC_OR_DEPARTMENT_OTHER): Payer: Medicaid - Out of State | Admitting: Gynecology

## 2020-11-07 ENCOUNTER — Inpatient Hospital Stay (HOSPITAL_COMMUNITY)
Admission: EM | Admit: 2020-11-07 | Discharge: 2020-11-07 | Disposition: A | Discharge status: Home / Self Care | Payer: Medicaid Other | Attending: Family Medicine | Admitting: Family Medicine

## 2020-11-07 ENCOUNTER — Encounter (HOSPITAL_COMMUNITY): Payer: Self-pay | Admitting: *Deleted

## 2020-11-07 ENCOUNTER — Other Ambulatory Visit: Payer: Self-pay

## 2020-11-07 DIAGNOSIS — R109 Unspecified abdominal pain: Secondary | ICD-10-CM | POA: Diagnosis not present

## 2020-11-07 DIAGNOSIS — Z3480 Encounter for supervision of other normal pregnancy, unspecified trimester: Secondary | ICD-10-CM

## 2020-11-07 DIAGNOSIS — Z20822 Contact with and (suspected) exposure to covid-19: Secondary | ICD-10-CM | POA: Insufficient documentation

## 2020-11-07 DIAGNOSIS — Z87891 Personal history of nicotine dependence: Secondary | ICD-10-CM | POA: Insufficient documentation

## 2020-11-07 DIAGNOSIS — Z720 Tobacco use: Secondary | ICD-10-CM

## 2020-11-07 DIAGNOSIS — Z79899 Other long term (current) drug therapy: Secondary | ICD-10-CM | POA: Diagnosis not present

## 2020-11-07 DIAGNOSIS — O26893 Other specified pregnancy related conditions, third trimester: Secondary | ICD-10-CM | POA: Diagnosis not present

## 2020-11-07 DIAGNOSIS — F1290 Cannabis use, unspecified, uncomplicated: Secondary | ICD-10-CM

## 2020-11-07 DIAGNOSIS — Z3A31 31 weeks gestation of pregnancy: Secondary | ICD-10-CM | POA: Diagnosis not present

## 2020-11-07 HISTORY — DX: Depression, unspecified: F32.A

## 2020-11-07 HISTORY — DX: Urinary tract infection, site not specified: N39.0

## 2020-11-07 LAB — COMPREHENSIVE METABOLIC PANEL
ALT: 14 U/L (ref 0–44)
AST: 17 U/L (ref 15–41)
Albumin: 3.5 g/dL (ref 3.5–5.0)
Alkaline Phosphatase: 125 U/L (ref 38–126)
Anion gap: 6 (ref 5–15)
BUN: 7 mg/dL (ref 6–20)
CO2: 22 mmol/L (ref 22–32)
Calcium: 8.6 mg/dL — ABNORMAL LOW (ref 8.9–10.3)
Chloride: 105 mmol/L (ref 98–111)
Creatinine, Ser: 0.59 mg/dL (ref 0.44–1.00)
GFR, Estimated: >60 mL/min (ref >60)
Glucose, Bld: 78 mg/dL (ref 70–99)
Potassium: 3.2 mmol/L — ABNORMAL LOW (ref 3.5–5.1)
Sodium: 133 mmol/L — ABNORMAL LOW (ref 135–145)
Total Bilirubin: 0.5 mg/dL (ref 0.3–1.2)
Total Protein: 7.1 g/dL (ref 6.5–8.1)

## 2020-11-07 LAB — CBC WITH DIFFERENTIAL/PLATELET
Abs Immature Granulocytes: 0.08 10*3/uL — ABNORMAL HIGH (ref 0.00–0.07)
Basophils Absolute: 0 10*3/uL (ref 0.0–0.1)
Basophils Relative: 1 %
Eosinophils Absolute: 0.1 10*3/uL (ref 0.0–0.5)
Eosinophils Relative: 1 %
HCT: 32.8 % — ABNORMAL LOW (ref 36.0–46.0)
Hemoglobin: 10.8 g/dL — ABNORMAL LOW (ref 12.0–15.0)
Immature Granulocytes: 1 %
Lymphocytes Relative: 22 %
Lymphs Abs: 1.9 10*3/uL (ref 0.7–4.0)
MCH: 29 pg (ref 26.0–34.0)
MCHC: 32.9 g/dL (ref 30.0–36.0)
MCV: 87.9 fL (ref 80.0–100.0)
Monocytes Absolute: 0.6 10*3/uL (ref 0.1–1.0)
Monocytes Relative: 7 %
Neutro Abs: 5.8 10*3/uL (ref 1.7–7.7)
Neutrophils Relative %: 68 %
Platelets: 154 10*3/uL (ref 150–400)
RBC: 3.73 MIL/uL — ABNORMAL LOW (ref 3.87–5.11)
RDW: 13.2 % (ref 11.5–15.5)
WBC: 8.5 10*3/uL (ref 4.0–10.5)
nRBC: 0 % (ref 0.0–0.2)

## 2020-11-07 LAB — RESP PANEL BY RT-PCR (FLU A&B, COVID) ARPGX2
Influenza A by PCR: NEGATIVE
Influenza B by PCR: NEGATIVE
SARS Coronavirus 2 by RT PCR: NEGATIVE

## 2020-11-07 MED ORDER — ACETAMINOPHEN 325 MG PO TABS
650.0000 mg | ORAL_TABLET | Freq: Once | ORAL | Status: AC
  Administered 2020-11-07: 650 mg via ORAL
  Filled 2020-11-07: qty 2

## 2020-11-07 MED ORDER — SODIUM CHLORIDE 0.9 % IV BOLUS
1000.0000 mL | Freq: Once | INTRAVENOUS | Status: AC
  Administered 2020-11-07: 1000 mL via INTRAVENOUS

## 2020-11-07 MED ORDER — NIFEDIPINE 10 MG PO CAPS
10.0000 mg | ORAL_CAPSULE | ORAL | Status: DC | PRN
  Administered 2020-11-07 (×2): 10 mg via ORAL
  Filled 2020-11-07 (×3): qty 1

## 2020-11-07 MED ORDER — LACTATED RINGERS IV BOLUS
1000.0000 mL | Freq: Once | INTRAVENOUS | Status: AC
  Administered 2020-11-07: 1000 mL via INTRAVENOUS

## 2020-11-07 MED ORDER — ONDANSETRON HCL 4 MG/2ML IJ SOLN
4.0000 mg | Freq: Once | INTRAMUSCULAR | Status: AC
  Administered 2020-11-07: 4 mg via INTRAVENOUS
  Filled 2020-11-07: qty 2

## 2020-11-07 MED ORDER — TERBUTALINE SULFATE 1 MG/ML IJ SOLN
0.2500 mg | Freq: Once | INTRAMUSCULAR | Status: AC
  Administered 2020-11-07: 0.25 mg via SUBCUTANEOUS
  Filled 2020-11-07: qty 1

## 2020-11-07 MED ORDER — CYCLOBENZAPRINE HCL 5 MG PO TABS
10.0000 mg | ORAL_TABLET | Freq: Once | ORAL | Status: AC
  Administered 2020-11-07: 10 mg via ORAL
  Filled 2020-11-07: qty 2

## 2020-11-07 NOTE — MAU Note (Signed)
Care asummed of patient in to evaluate pt status after medication administration and first dose of procardia . Pt states that she has always had strong "Braxton hicks"contractions since 26weeks and these contractions dont feel any different . What concerned her the most today was the combination of the diarrhea and nausea and vomiting. 2nd Procardia given per order

## 2020-11-07 NOTE — ED Triage Notes (Signed)
Pt abd pain started an hour ago, woke pt up and began to have emesis. Constant pain to top of abd.  Pt is 8 months pregnant, due 12/31/20 per pt. Pt with lots of white in color vaginal discharge. Pt states first pregnancy.

## 2020-11-07 NOTE — ED Provider Notes (Signed)
This patient was accepted at change of shift, the patient presents with what appears to be preterm labor, her laboratory work-up is unremarkable, vital signs are good, she is contracting every several minutes, Dr. Jeannetta Nap has accepted this patient in transfer to the [facility] to the maternal admissions unit.   Noemi Chapel, MD 11/07/20 [phone]

## 2020-11-07 NOTE — MAU Note (Signed)
Pt transfer via carelink from [facility].  Starts at the top of the abd, goes down and then around to the back.  Pain started around 1300. No bleeding or ROM

## 2020-11-07 NOTE — ED Provider Notes (Signed)
[facility] EMERGENCY DEPARTMENT Provider Note   CSN: [id number] Arrival date & time: 11/07/20  1259     History Chief Complaint  Patient presents with   Abdominal Pain    Sydney Higgins is a 25 y.o. female.  Patient states that she has been having some abdominal cramping with nausea.  She is 8 months pregnant no bleeding.  This is the patient's first pregnancy  The history is provided by the patient and medical records. No language interpreter was used.  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   02/19/2019>Repeat in 1 year per [facility],     Patient Active Problem List   Diagnosis Date Noted   Abnormal chromosomal and genetic finding on antenatal screening mother 06/30/2020   Encounter for supervision of normal pregnancy, antepartum 06/18/2020   Vapes nicotine containing substance 06/18/2020   Marijuana use 06/18/2020   MDD (major depressive disorder) 05/19/2019   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   Mass of left side of neck    Breast pain 01/11/2017   Constipation 12/17/2015   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   DMDD (disruptive mood dysregulation disorder) ([place]) 12/11/2015   Insomnia 12/11/2015    Past Surgical History:  Procedure Laterality Date   MASS EXCISION Left 08/26/2017   Procedure: EXCISION SUPERFICIAL MASS FROM LEFT SIDE OF NECK;   Surgeon: Virl Cagey, MD;  Location: [facility];  Service: General;  Laterality: Left;     OB History     Gravida  2   Para      Term      Preterm      AB  1   Living  0      SAB  1   IAB      Ectopic      Multiple      Live Births              Family History  Problem Relation Age of Onset   Cancer Paternal Grandfather    Cancer Paternal Grandmother    Diabetes Maternal Grandmother    Diabetes Maternal Grandfather    Hypertension Father    Bipolar disorder Brother    Deafness Sister    Diabetes Maternal Uncle     Social History   Tobacco Use   Smoking status: Former    Types: E-cigarettes   Smokeless tobacco: Never  Scientific laboratory technician Use: Every day  Substance Use Topics   Alcohol use: Not Currently   Drug use: Yes    Types: Marijuana    Comment: occ    Home Medications Prior to Admission medications   Medication Sig Start Date End Date Taking? Authorizing Provider  acetaminophen (TYLENOL) 500 MG tablet Take 500 mg by mouth every 6 (six) hours as needed for mild pain, fever or headache.  Yes [provider]  busPIRone (BUSPAR) 5 MG tablet Take 1 tablet (5 mg total) by mouth 3 (three) times daily. 06/18/20  Yes Serita Grammes D, CNM  Prenatal Vit-Fe Fumarate-FA (MULTIVITAMIN-PRENATAL) 27-0.8 MG TABS tablet Take 1 tablet by mouth daily at 12 noon.   Yes [provider]    Allergies    Apple, Banana, Bee venom, and Pear  Review of Systems   Review of Systems  Constitutional:  Negative for appetite change and fatigue.  HENT:  Negative for congestion, ear discharge and sinus pressure.   Eyes:  Negative for discharge.  Respiratory:  Negative for cough.   Cardiovascular:  Negative for chest pain.  Gastrointestinal:  Positive for abdominal pain. Negative for diarrhea.  Genitourinary:  Negative for frequency and hematuria.  Musculoskeletal:  Negative for back pain.  Skin:  Negative for rash.  Neurological:  Negative for  seizures and headaches.  Psychiatric/Behavioral:  Negative for hallucinations.    Physical Exam Updated Vital Signs BP 100/71   Pulse (!) 58   Temp 98.6 F (37 C) (Oral)   Resp 18   Ht 5' (1.524 m)   Wt 54.4 kg   LMP 03/29/2020   SpO2 100%   BMI 23.44 kg/m   Physical Exam Vitals and nursing note reviewed.  Constitutional:      Appearance: She is well-developed.  HENT:     Head: Normocephalic.     Nose: Nose normal.  Eyes:     General: No scleral icterus.    Conjunctiva/sclera: Conjunctivae normal.  Neck:     Thyroid: No thyromegaly.  Cardiovascular:     Rate and Rhythm: Normal rate and regular rhythm.     Heart sounds: No murmur heard.   No friction rub. No gallop.  Pulmonary:     Breath sounds: No stridor. No wheezing or rales.  Chest:     Chest wall: No tenderness.  Abdominal:     General: There is no distension.     Tenderness: There is no abdominal tenderness. There is no rebound.     Comments: Patient nontender abdomen.  She has abdomen consistent with a month pregnancy  Genitourinary:    Comments: Bimanual exam showed vertex presentation cervix is 1 cm and not effaced Musculoskeletal:        General: Normal range of motion.     Cervical back: Neck supple.  Lymphadenopathy:     Cervical: No cervical adenopathy.  Skin:    Findings: No erythema or rash.  Neurological:     Mental Status: She is alert and oriented to person, place, and time.     Motor: No abnormal muscle tone.     Coordination: Coordination normal.  Psychiatric:        Behavior: Behavior normal.    ED Results / Procedures / Treatments   Labs (all labs ordered are listed, but only abnormal results are displayed) Labs Reviewed  CBC WITH DIFFERENTIAL/PLATELET - Abnormal; Notable for the following components:      Result Value   RBC 3.73 (*)    Hemoglobin 10.8 (*)    HCT 32.8 (*)    Abs Immature Granulocytes 0.08 (*)    All other components within normal limits  COMPREHENSIVE  METABOLIC PANEL - Abnormal; Notable for the following components:   Sodium 133 (*)    Potassium 3.2 (*)    Calcium 8.6 (*)    All other components within normal limits  URINALYSIS, ROUTINE W REFLEX MICROSCOPIC    EKG None  Radiology No results found.  Procedures Procedures   Medications Ordered in ED Medications  sodium chloride 0.9 % bolus 1,000 mL (1,000 mLs Intravenous New Bag/Given 11/07/20 1420)  ondansetron (ZOFRAN) injection 4 mg (4 mg Intravenous Given 11/07/20 1420)    ED Course  I have reviewed the triage vital signs and the nursing notes.  Pertinent labs & imaging results that were available during my care of the patient were reviewed by me and considered in my medical decision making (see chart for details).    MDM Rules/Calculators/A&P                          Patient with possible early contractions.  We are waiting OB/GYN suggestion  Final Clinical Impression(s) / ED Diagnoses Final diagnoses:  None    Rx / DC Orders ED Discharge Orders     None        Milton Ferguson, MD 11/09/20 1009

## 2020-11-07 NOTE — MAU Note (Signed)
Pt states she notes a difference with her uterus being more relaxed and denies feeling her Braxton Hicks contractions. Anxious to go home.3rd dose of Procardia held per order CNM

## 2020-11-07 NOTE — MAU Provider Note (Addendum)
History     CSN: [id number]  Arrival date and time: 11/07/20 1259   Event Date/Time   First Provider Initiated Contact with Patient 11/07/20 1844      Chief Complaint  Patient presents with   Abdominal Pain   HPI Sydney Higgins is a 25 y.o. G2P0010 at 48w6dwho [facility] [place]. [facility] She endorses being told she was a FT dilated during her appointments there. On arrival to MAU she endorses feeling "better" with pain score 5/10. She denies vaginal bleeding, leaking of fluid, decreased fetal movement, fever, falls, or recent illness.   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   02/19/2019>Repeat in 1 year per [facility],    UTI (urinary tract infection)     Past Surgical History:  Procedure Laterality Date   MASS EXCISION Left 08/26/2017   Procedure: EXCISION SUPERFICIAL MASS FROM LEFT SIDE OF NECK;  Surgeon: BVirl Cagey MD;  Location: [facility];  Service: General;  Laterality: Left;    Family History  Problem Relation Age of Onset   Cancer Paternal Grandfather    Cancer Paternal Grandmother    Diabetes Maternal Grandmother    Diabetes Maternal Grandfather    Hypertension Father    Bipolar disorder Brother    Deafness Sister    Diabetes Maternal Uncle     Social History   Tobacco Use   Smoking status: Former    Types: E-cigarettes   Smokeless tobacco: Never  VScientific laboratory technicianUse: Every day  Substance Use Topics    Alcohol use: Not Currently   Drug use: Yes    Types: Marijuana    Comment: occ    Allergies:  Allergies  Allergen Reactions   Apple Other (See Comments)    Can eat the fruit, but not the peel Mouth itches   Banana Other (See Comments)    Pt prefrence   Bee Venom Swelling   Pear Other (See Comments)    Mouth itches    Medications Prior to Admission  Medication Sig Dispense Refill Last Dose   acetaminophen (TYLENOL) 500 MG tablet Take 500 mg by mouth every 6 (six) hours as needed for mild pain, fever or headache.   Past Month   busPIRone (BUSPAR) 5 MG tablet Take 1 tablet (5 mg total) by mouth 3 (three) times daily. 90 tablet 3    Prenatal Vit-Fe Fumarate-FA (MULTIVITAMIN-PRENATAL) 27-0.8 MG TABS tablet Take 1 tablet by mouth daily at 12 noon.   11/06/2020    Review of Systems  Gastrointestinal:  Positive for abdominal pain.  All other systems reviewed and are negative. Physical Exam   Blood pressure (Marland Kitchen  101/55, pulse (!) 58, temperature 98.4 F (36.9 C), temperature source Oral, resp. rate 17, height 5' (1.524 m), weight 54.4 kg, last menstrual period 03/29/2020, SpO2 100 %.  Physical Exam Vitals and nursing note reviewed. Exam conducted with a chaperone present.  Constitutional:      Appearance: She is well-developed. She is not ill-appearing.  Cardiovascular:     Rate and Rhythm: Normal rate.     Heart sounds: Normal heart sounds.  Pulmonary:     Effort: Pulmonary effort is normal.  Abdominal:     Comments: Gravid  Skin:    Capillary Refill: Capillary refill takes less than 2 seconds.  Neurological:     Mental Status: She is alert and oriented to person, place, and time.  Psychiatric:        Mood and Affect: Mood normal.        Behavior: Behavior normal.    MAU Course  Procedures Results for orders placed or performed during the hospital encounter of 11/07/20 (from the past 24 hour(s))  CBC with Differential/Platelet     Status: Abnormal   Collection Time:  11/07/20  2:35 PM  Result Value Ref Range   WBC 8.5 4.0 - 10.5 K/uL   RBC 3.73 (L) 3.87 - 5.11 MIL/uL   Hemoglobin 10.8 (L) 12.0 - 15.0 g/dL   HCT 32.8 (L) 36.0 - 46.0 %   MCV 87.9 80.0 - 100.0 fL   MCH 29.0 26.0 - 34.0 pg   MCHC 32.9 30.0 - 36.0 g/dL   RDW 13.2 11.5 - 15.5 %   Platelets 154 150 - 400 K/uL   nRBC 0.0 0.0 - 0.2 %   Neutrophils Relative % 68 %   Neutro Abs 5.8 1.7 - 7.7 K/uL   Lymphocytes Relative 22 %   Lymphs Abs 1.9 0.7 - 4.0 K/uL   Monocytes Relative 7 %   Monocytes Absolute 0.6 0.1 - 1.0 K/uL   Eosinophils Relative 1 %   Eosinophils Absolute 0.1 0.0 - 0.5 K/uL   Basophils Relative 1 %   Basophils Absolute 0.0 0.0 - 0.1 K/uL   Immature Granulocytes 1 %   Abs Immature Granulocytes 0.08 (H) 0.00 - 0.07 K/uL  Comprehensive metabolic panel     Status: Abnormal   Collection Time: 11/07/20  2:35 PM  Result Value Ref Range   Sodium 133 (L) 135 - 145 mmol/L   Potassium 3.2 (L) 3.5 - 5.1 mmol/L   Chloride 105 98 - 111 mmol/L   CO2 22 22 - 32 mmol/L   Glucose, Bld 78 70 - 99 mg/dL   BUN 7 6 - 20 mg/dL   Creatinine, Ser 0.59 0.44 - 1.00 mg/dL   Calcium 8.6 (L) 8.9 - 10.3 mg/dL   Total Protein 7.1 6.5 - 8.1 g/dL   Albumin 3.5 3.5 - 5.0 g/dL   AST 17 15 - 41 U/L   ALT 14 0 - 44 U/L   Alkaline Phosphatase 125 38 - 126 U/L   Total Bilirubin 0.5 0.3 - 1.2 mg/dL   GFR, Estimated >60 >60 mL/min   Anion gap 6 5 - 15  Resp Panel by RT-PCR (Flu A&B, Covid) Nasopharyngeal Swab     Status: None   Collection Time: 11/07/20  5:02 PM   Specimen: Nasopharyngeal Swab; Nasopharyngeal(NP) swabs in vial transport medium  Result Value Ref Range   SARS Coronavirus 2 by RT PCR NEGATIVE NEGATIVE   Influenza A by PCR NEGATIVE NEGATIVE   Influenza B by PCR NEGATIVE  NEGATIVE    --Patient checked in [facility] prior to transfer, unable to collect FFN. Closed on arrival to MAU with regular contractions, pain score 5/10. Will order Procardia series with low threshold for Terbutaline  Report  given to Milinda Cave, CNM who assumes care at this time  Mallie Snooks, MSN, CNM Certified Nurse Midwife, [facility] for [employer], [place] 11/07/20 8:01 PM   Assessment and Plan  Reassessment (8:50 PM)  -Patient reports resolution of pain. -Rates pain a negative 5/10! -Informed that cervical exam would be deferred as initial was closed. Patient agreeable. -Discussed follow up and patient reports she is attempting to be seen at [facility] as a transfer from [place]. -Message sent to [place]. Regarding contacting patient for follow up. -Patient states she was never given a cup for a urine sample, but has been urinating without issues.  -Encouraged to call or return to MAU if symptoms worsen or with the onset of new symptoms. -Discharged to home in improved condition.  Maryann Conners MSN, CNM Advanced Practice Provider, Center for [employer]'

## 2020-11-07 NOTE — Progress Notes (Signed)
OB RR RN contacted by RN at [facility] emergency department to remotely monitor patient. Pt. Arrives with upper abdominal pain and emesis. Pt. Also starts she has copious white discharge. Pt. Denies leaking of amniotic fluid or vaginal bleeding. Pt. Not seen regularly for prenatal care. Pt. Does have issues with housing that has been affecting that care.  Pt. Put on fetal heart monitor and toco at this time by emergency room RN.

## 2020-11-07 NOTE — Progress Notes (Signed)
ED RN contacted [facility] RN about updates for patient. Patient cervical exam 0.5cm by Dr. Roderic Palau and consistent with previous evaluations. Pt. States she is still having upper abdominal pain, but nausea/vomiting has subsided. FHR category I tracing with no obvious uterine contractions noted on toco tracing. Zofran and IV fluid bolus given.   Urine analysis not yet collected, CMP in progress, and final results for CBC in chart review.   Will follow up with attending MD when available.

## 2020-11-07 NOTE — Progress Notes (Signed)
OB RR RN spoke again with ED RN, Jerene Pitch. EDP Dr. Sabra Heck is concerned about patient and would like her transferred to the MAU for labor rule out since she is contracting. Attending Dr. Kennon Rounds notified of this decision.   Report to MAU charge RN called at this time. Pt. To be transferred via carelink to MAU.   Category I fetal heart rate tracing with ctx every 3-35mnutes.

## 2020-11-11 ENCOUNTER — Ambulatory Visit (HOSPITAL_BASED_OUTPATIENT_CLINIC_OR_DEPARTMENT_OTHER): Payer: Medicaid - Out of State | Admitting: Student in an Organized Health Care Education/Training Program

## 2020-11-27 ENCOUNTER — Ambulatory Visit (INDEPENDENT_AMBULATORY_CARE_PROVIDER_SITE_OTHER): Payer: Medicaid Other | Admitting: Obstetrics & Gynecology

## 2020-11-27 ENCOUNTER — Other Ambulatory Visit (INDEPENDENT_AMBULATORY_CARE_PROVIDER_SITE_OTHER): Payer: Medicaid Other

## 2020-11-27 ENCOUNTER — Encounter: Payer: Self-pay | Admitting: Obstetrics & Gynecology

## 2020-11-27 ENCOUNTER — Other Ambulatory Visit: Payer: Self-pay

## 2020-11-27 ENCOUNTER — Other Ambulatory Visit: Payer: Self-pay | Admitting: Obstetrics & Gynecology

## 2020-11-27 VITALS — BP 127/86 | HR 69 | Wt 121.0 lb

## 2020-11-27 DIAGNOSIS — O0993 Supervision of high risk pregnancy, unspecified, third trimester: Secondary | ICD-10-CM

## 2020-11-27 DIAGNOSIS — F1290 Cannabis use, unspecified, uncomplicated: Secondary | ICD-10-CM

## 2020-11-27 DIAGNOSIS — O26843 Uterine size-date discrepancy, third trimester: Secondary | ICD-10-CM | POA: Diagnosis not present

## 2020-11-27 DIAGNOSIS — Z23 Encounter for immunization: Secondary | ICD-10-CM | POA: Diagnosis not present

## 2020-11-27 DIAGNOSIS — Z3A34 34 weeks gestation of pregnancy: Secondary | ICD-10-CM

## 2020-11-27 DIAGNOSIS — Z3480 Encounter for supervision of other normal pregnancy, unspecified trimester: Secondary | ICD-10-CM

## 2020-11-27 DIAGNOSIS — Z720 Tobacco use: Secondary | ICD-10-CM

## 2020-11-27 DIAGNOSIS — Z363 Encounter for antenatal screening for malformations: Secondary | ICD-10-CM

## 2020-11-27 DIAGNOSIS — O0933 Supervision of pregnancy with insufficient antenatal care, third trimester: Secondary | ICD-10-CM

## 2020-11-27 DIAGNOSIS — F419 Anxiety disorder, unspecified: Secondary | ICD-10-CM

## 2020-11-27 DIAGNOSIS — O365930 Maternal care for other known or suspected poor fetal growth, third trimester, not applicable or unspecified: Secondary | ICD-10-CM

## 2020-11-27 MED ORDER — BUSPIRONE HCL 5 MG PO TABS: 5.0000 mg | ORAL_TABLET | Freq: Three times a day (TID) | ORAL | 3 refills | Status: AC

## 2020-11-27 NOTE — Progress Notes (Signed)
HIGH-RISK PREGNANCY VISIT Patient name: Sydney Higgins MRN [id number]  Date of birth: 1995-09-17 Chief Complaint:   Routine Prenatal Visit (Transfer of care)  History of Present Illness:   Sydney Higgins is a 25 y.o. G2P0010 female at 31w5dwith an Estimated Date of Delivery: 01/03/21 being seen today for ongoing management of a high-risk pregnancy complicated by   -fetal growth restriction 5% -scant PNC (see below) -Anxiety- on buspar '5mg'$  daily- ran out wishes to restart  Late transfer of care @ 34wk5d  -[place] EFW 5% with AC in 4% Her last prenatal visit was this July appt, no visits in August- [place]  [place] more support  -Seen in hospital @ 250w6ddue to preterm labor short cervix noted 1.6cm cervical length, s/p BMZ course (7/26-27)  -housing instability -scant PNC -PMHX: self harm, bulimia, depression   Today she reports no complaints.   Contractions: Not present. Vag. Bleeding: None.  Movement: Present. denies leaking of fluid.   [facility] 06/18/2020 02/14/2019 01/31/2019 01/11/2017  Decreased Interest '2 2 2 2  '$ Down, Depressed, Hopeless '2 1 1 3  '$ PHQ - 2 Score '4 3 3 5  '$ Altered sleeping 0 '1 2 3  '$ Tired, decreased energy '3 1 2 2  '$ Change in appetite '3 2 1 3  '$ Feeling bad or failure about yourself  '2 1 2 2  '$ Trouble concentrating '1 3 2 2  '$ Moving slowly or fidgety/restless 0 '1 1 2  '$ Suicidal thoughts '1 1 1 1  '$ PHQ-9 Score '14 13 14 20  '$ Difficult doing work/chores - - Very difficult -     Current Outpatient Medications  Medication Instructions   acetaminophen (TYLENOL) 500 mg, Oral, Every 6 hours PRN   busPIRone (BUSPAR) 5 mg, Oral, 3 times daily   Prenatal Vit-Fe Fumarate-FA (MULTIVITAMIN-PRENATAL) 27-0.8 MG TABS tablet 1 tablet, Oral, Daily     Review of Systems:   Pertinent items are noted  in HPI Denies abnormal vaginal discharge w/ itching/odor/irritation, headaches, visual changes, shortness of breath, chest pain, abdominal pain, severe nausea/vomiting, or problems with urination or bowel movements unless otherwise stated above. Pertinent History Reviewed:  Reviewed past medical,surgical, social, obstetrical and family history.  Reviewed problem list, medications and allergies. Physical Assessment:   Vitals:   11/27/20 1333  BP: 127/86  Pulse: 69  Weight: 121 lb (54.9 kg)  Body mass index is 23.63 kg/m.           Physical Examination:   General appearance: alert, well appearing, and in no distress  Mental status: alert, oriented to person, place, and time  Skin: warm & dry   Extremities: Edema: None    Cardiovascular: normal heart rate noted  Respiratory: normal respiratory effort, no distress  Abdomen: gravid, soft, non-tender  Pelvic: Cervical exam deferred         Fetal Status: Fetal Heart Rate (bpm): 150 Fundal Height: 27 cm Movement: Present    Fetal Surveillance Testing today: doppler,  if possible [place] be completed later today   Chaperone: N/A    No results found for this or any previous visit (from the past 24 hour(s)).   Assessment & Plan:  High-risk pregnancy: G2P0010 at 3477w5dth an Estimated Date of Delivery: 01/03/21   1) IUGR -plan to start BPP, doppler/NST weekly -growth  scan next available  2) Scant PNC/Late transfer -records reviewed, labs completed though HIV test results, not seen- will order today  Meds:  Meds ordered this encounter  Medications   busPIRone (BUSPAR) 5 MG tablet    Sig: Take 1 tablet (5 mg total) by mouth 3 (three) times daily.    Dispense:  90 tablet    Refill:  3    Labs/procedures today: HIV testing today, plan to start antepartum testing  Treatment Plan:  see above  Reviewed: Preterm labor symptoms and general obstetric precautions including but not limited to vaginal bleeding, contractions, leaking of  fluid and fetal movement were reviewed in detail with the patient.  All questions were answered.   Follow-up: Return in about 1 week (around 12/04/2020) for Growth scan with doppler next visit (IUGR), weekly BPP with doppler and NST.   Future Appointments  Date Time Provider [place]  11/27/2020  4:00 PM [facility] [place] [facility] None    Orders Placed This Encounter  Procedures   Tdap vaccine greater than or equal to 7yo IM   OB RESULTS CONSOLE RPR   OB RESULTS CONSOLE Rubella Antibody   OB RESULTS CONSOLE Varicella zoster antibody, IgG   OB RESULTS CONSOLE Hepatitis B surface antigen   Hepatitis C antibody   HIV Antibody (routine testing w rflx)    Janyth Pupa, DO Attending [place], [place] for [employer], [place]

## 2020-11-27 NOTE — Progress Notes (Signed)
[place] [id number] wks,cephalic,anterior placenta gr 3,fhr 159 bpm,AFI 12.3 cm,RI .66,.60,.63=71%,BPP 8/8,EFW 1932 g 3.7%,anatomy complete,limited view because of fetal age

## 2020-11-28 LAB — HIV ANTIBODY (ROUTINE TESTING W REFLEX): HIV Screen 4th Generation wRfx: NONREACTIVE

## 2020-12-02 ENCOUNTER — Other Ambulatory Visit: Payer: Self-pay

## 2020-12-02 ENCOUNTER — Other Ambulatory Visit: Payer: Self-pay | Admitting: Obstetrics & Gynecology

## 2020-12-02 ENCOUNTER — Ambulatory Visit (INDEPENDENT_AMBULATORY_CARE_PROVIDER_SITE_OTHER): Payer: Medicaid Other | Admitting: *Deleted

## 2020-12-02 VITALS — BP 119/78 | HR 93 | Wt 125.0 lb

## 2020-12-02 DIAGNOSIS — Z3A35 35 weeks gestation of pregnancy: Secondary | ICD-10-CM

## 2020-12-02 DIAGNOSIS — IMO0002 Reserved for concepts with insufficient information to code with codable children: Secondary | ICD-10-CM

## 2020-12-02 DIAGNOSIS — O0993 Supervision of high risk pregnancy, unspecified, third trimester: Secondary | ICD-10-CM

## 2020-12-02 NOTE — Progress Notes (Addendum)
   NURSE VISIT- NST  SUBJECTIVE:  Sydney Higgins is a 25 y.o. G2P0010 female at [redacted]w[redacted]d here for a NST for pregnancy complicated by FGR.  She reports active fetal movement, contractions: none, vaginal bleeding: none, membranes: intact.   OBJECTIVE:  BP 119/78   Pulse 93   Wt 125 lb (56.7 kg)   LMP 03/29/2020   BMI 24.41 kg/m   Appears well, no apparent distress  No results found for this or any previous visit (from the past 24 hour(s)).  NST: FHR baseline 145 bpm, Variability: moderate, Accelerations:present, Decelerations:  Absent= Cat 1/reactive Toco: occasional   ASSESSMENT: G2P0010 at 359w3dith FGR NST reactive  PLAN: EFM strip reviewed by Dr. EuElonda Husky Recommendations: keep next appointment as scheduled    LaAlice Rieger9/08/2020 3:22 PM   Attestation of Attending Supervision of Nursing Visit Encounter: Evaluation and management procedures were performed by the nursing staff under my supervision and collaboration.  I have reviewed the nurse's note and chart, and I agree with the management and plan.  LuJacelyn GripD Attending Physician for the Center for [facility] 12/05/2020 11:25 PM

## 2020-12-04 ENCOUNTER — Encounter: Payer: Medicaid Other | Admitting: Obstetrics & Gynecology

## 2020-12-04 ENCOUNTER — Ambulatory Visit (INDEPENDENT_AMBULATORY_CARE_PROVIDER_SITE_OTHER): Payer: Medicaid Other

## 2020-12-04 ENCOUNTER — Encounter: Payer: Self-pay | Admitting: Advanced Practice Midwife

## 2020-12-04 ENCOUNTER — Other Ambulatory Visit: Payer: Self-pay

## 2020-12-04 ENCOUNTER — Ambulatory Visit (INDEPENDENT_AMBULATORY_CARE_PROVIDER_SITE_OTHER): Payer: Medicaid Other | Admitting: Advanced Practice Midwife

## 2020-12-04 VITALS — BP 124/84 | HR 80 | Wt 126.0 lb

## 2020-12-04 DIAGNOSIS — Z720 Tobacco use: Secondary | ICD-10-CM

## 2020-12-04 DIAGNOSIS — Z3A35 35 weeks gestation of pregnancy: Secondary | ICD-10-CM

## 2020-12-04 DIAGNOSIS — Z3480 Encounter for supervision of other normal pregnancy, unspecified trimester: Secondary | ICD-10-CM

## 2020-12-04 DIAGNOSIS — F1290 Cannabis use, unspecified, uncomplicated: Secondary | ICD-10-CM

## 2020-12-04 DIAGNOSIS — O0993 Supervision of high risk pregnancy, unspecified, third trimester: Secondary | ICD-10-CM

## 2020-12-04 DIAGNOSIS — O365930 Maternal care for other known or suspected poor fetal growth, third trimester, not applicable or unspecified: Secondary | ICD-10-CM

## 2020-12-04 DIAGNOSIS — IMO0002 Reserved for concepts with insufficient information to code with codable children: Secondary | ICD-10-CM

## 2020-12-04 LAB — POCT URINALYSIS DIPSTICK OB
Blood, UA: NEGATIVE
Glucose, UA: NEGATIVE
Ketones, UA: NEGATIVE
Nitrite, UA: NEGATIVE
POC,PROTEIN,UA: NEGATIVE

## 2020-12-04 NOTE — Progress Notes (Signed)
HIGH-RISK PREGNANCY VISIT Patient name: Sydney Higgins MRN [id number]  Date of birth: Apr 17, 1995 Chief Complaint:   High Risk Gestation ([place] today; + pressure)   History of Present Illness:   Sydney Higgins is a 25 y.o. G2P0010 female at 63w5dwith an Estimated Date of Delivery: 01/03/21 being seen today for ongoing management of a high-risk pregnancy complicated by IUGR Today she reports some pressure. Contractions: Irritability. Vag. Bleeding: None.  Movement: Present. denies leaking of fluid.  Review of Systems:   Pertinent items are noted in HPI Denies abnormal vaginal discharge w/ itching/odor/irritation, headaches, visual changes, shortness of breath, chest pain, abdominal pain, severe nausea/vomiting, or problems with urination or bowel movements unless otherwise stated above. Pertinent History Reviewed:  Reviewed past medical,surgical, social, obstetrical and family history.  Reviewed problem list, medications and allergies. Physical Assessment:   Vitals:   12/04/20 1407  BP: 124/84  Pulse: 80  Weight: 126 lb (57.2 kg)  Body mass index is 24.61 kg/m.           Physical Examination:   General appearance: alert, well appearing, and in no distress  Mental status: alert, oriented to person, place, and time  Skin: warm & dry   Extremities: Edema: None    Cardiovascular: normal heart rate noted  Respiratory: normal respiratory effort, no distress  Abdomen: gravid, soft, non-tender  Pelvic: Cervical exam deferred         Fetal Status: Fetal Heart Rate (bpm): u[place]  Movement: Present    Fetal Surveillance Testing today: [id number],cephalic,anterior placenta gr 3,fhr 157 bpm,BPP 8/8,AFI 11.6 cm,RI .60,.58,.62=63%   Results for orders placed or performed in visit on 12/04/20 (from the past 24 hour(s))  POC Urinalysis Dipstick OB   Collection Time: 12/04/20  2:11 PM  Result Value Ref Range   Color, UA     Clarity, UA     Glucose, UA Negative Negative   Bilirubin, UA     Ketones,  UA neg    Spec Grav, UA     Blood, UA neg    pH, UA     POC,PROTEIN,UA Negative Negative, Trace, Small (1+), Moderate (2+), Large (3+), 4+   Urobilinogen, UA     Nitrite, UA neg    Leukocytes, UA Trace (A) Negative   Appearance     Odor      Assessment & Plan:  1) High-risk pregnancy G2P0010 at 365w5dith an Estimated Date of Delivery: 01/03/21   2) IUGR, Dopplers normal Treatment plan  plan IOL 37-38 weeks (pt prefers closer to 37 if possible d[facility]t [facility] Twice weekly testing (or weekly bpp as schedule allows); growth [place] 3 weeks   Meds: No orders of the defined types were placed in this encounter.    Labs/procedures today: dopplers/BPP   Reviewed: Preterm labor symptoms and general obstetric precautions including but not limited to vaginal bleeding, contractions, leaking of fluid and fetal movement were reviewed in detail with the patient.  All questions were answered. Has home bp cuff. Check bp weekly, let usKoreanow if >140/90.   Follow-up: No follow-ups on file.  Future Appointments  Date Time Provider [place]9/02/2021  3:10 PM OzJanyth PupaDO [facility]  12/11/2020  8:30 AM [facility] NURSE [facility]  12/15/2020  3:50 PM [facility] NURSE [facility]  12/18/2020  2:15 PM [facility] [place]-FTIMG None  12/18/2020  3:10 PM BePatriciaann ClanDO [facility]  Orders Placed This Encounter  Procedures   POC Urinalysis Dipstick OB   Christin Fudge DNP, CNM 12/04/2020 4:08 PM

## 2020-12-04 NOTE — Progress Notes (Signed)
[place] [id number] wks,cephalic,anterior placenta gr 3,fhr 157 bpm,BPP 8/8,AFI 11.6 cm,RI .60,.58,.62=63%

## 2020-12-08 ENCOUNTER — Other Ambulatory Visit (HOSPITAL_COMMUNITY)
Admission: RE | Admit: 2020-12-08 | Discharge: 2020-12-08 | Disposition: A | Discharge status: Home / Self Care | Payer: Medicaid Other | Source: Ambulatory Visit | Attending: Obstetrics & Gynecology | Admitting: Obstetrics & Gynecology

## 2020-12-08 ENCOUNTER — Encounter: Payer: Self-pay | Admitting: Obstetrics & Gynecology

## 2020-12-08 ENCOUNTER — Ambulatory Visit (INDEPENDENT_AMBULATORY_CARE_PROVIDER_SITE_OTHER): Payer: Medicaid Other | Admitting: Obstetrics & Gynecology

## 2020-12-08 ENCOUNTER — Other Ambulatory Visit: Payer: Self-pay

## 2020-12-08 ENCOUNTER — Other Ambulatory Visit: Payer: Medicaid Other

## 2020-12-08 VITALS — BP 112/81 | HR 101 | Wt 127.4 lb

## 2020-12-08 DIAGNOSIS — O365990 Maternal care for other known or suspected poor fetal growth, unspecified trimester, not applicable or unspecified: Secondary | ICD-10-CM

## 2020-12-08 DIAGNOSIS — Z3A36 36 weeks gestation of pregnancy: Secondary | ICD-10-CM

## 2020-12-08 DIAGNOSIS — O0993 Supervision of high risk pregnancy, unspecified, third trimester: Secondary | ICD-10-CM | POA: Diagnosis not present

## 2020-12-08 NOTE — Progress Notes (Signed)
HIGH-RISK PREGNANCY VISIT Patient name: Sydney Higgins MRN [id number]  Date of birth: 10-17-1995 Chief Complaint:   Routine Prenatal Visit (Culture today) and Non-stress Test  History of Present Illness:   Sydney Higgins is a 25 y.o. G73P0010 female at 55w2dwith an Estimated Date of Delivery: 01/03/21 being seen today for ongoing management of a high-risk pregnancy complicated by:  IUGR- Growth on 9/1- 1932g (3.7%) last BPP- 9/8:cephalic,anterior placenta gr 3,fhr 157 bpm,BPP 8/8,AFI 11.6 cm,RI .60,.58,.62=63%  Today she reports irregular pelvic and back pain. Also notes vaginal discomfort.  Notes change in movement, but still reports regular movement. Contractions: Irritability. Vag. Bleeding: None.  Movement: (!) Decreased. denies leaking of fluid.   [facility] 06/18/2020 02/14/2019 01/31/2019 01/11/2017  Decreased Interest '2 2 2 2  '$ Down, Depressed, Hopeless '2 1 1 3  '$ PHQ - 2 Score '4 3 3 5  '$ Altered sleeping 0 '1 2 3  '$ Tired, decreased energy '3 1 2 2  '$ Change in appetite '3 2 1 3  '$ Feeling bad or failure about yourself  '2 1 2 2  '$ Trouble concentrating '1 3 2 2  '$ Moving slowly or fidgety/restless 0 '1 1 2  '$ Suicidal thoughts '1 1 1 1  '$ PHQ-9 Score '14 13 14 20  '$ Difficult doing work/chores - - Very difficult -     Current Outpatient Medications  Medication Instructions   acetaminophen (TYLENOL) 500 mg, Every 6 hours PRN   busPIRone (BUSPAR) 5 mg, Oral, 3 times daily   Prenatal Vit-Fe Fumarate-FA (MULTIVITAMIN-PRENATAL) 27-0.8 MG TABS tablet 1 tablet, Oral, Daily     Review of Systems:   Pertinent items are noted in HPI Denies abnormal vaginal discharge w/ itching/odor/irritation, headaches, visual changes, shortness of breath, chest pain, abdominal pain, severe nausea/vomiting, or problems with urination or bowel movements unless otherwise stated above. Pertinent History Reviewed:  Reviewed past medical,surgical, social, obstetrical and family history.  Reviewed problem list,  medications and allergies. Physical Assessment:   Vitals:   12/08/20 1502  BP: 112/81  Pulse: (!) 101  Weight: 127 lb 6.4 oz (57.8 kg)  Body mass index is 24.88 kg/m.           Physical Examination:   General appearance: alert, well appearing, and in no distress  Mental status: normal mood, behavior, speech, dress, motor activity, and thought processes  Skin: warm & dry   Extremities: Edema: None    Cardiovascular: normal heart rate noted  Respiratory: normal respiratory effort, no distress  Abdomen: gravid, soft, non-tender  Pelvic: Cervical exam performed  Dilation: Closed Effacement (%): 50 Station: -3  Fetal Status: Fetal Heart Rate (bpm): 145  NST   Movement: (!) Decreased Presentation: Vertex  Fetal Surveillance Testing today: NST  NST being performed due to IUGR   Fetal Monitoring:  Baseline: 145 bpm, Variability: moderate, Accelerations: Present, The accelerations are >15 bpm and more than 2 in 20 minutes, and Decelerations: Absent     Final diagnosis:  Reactive NST      Chaperone: ACelene Squibb   No results found for this or any previous visit (from the past 24 hour(s)).   Assessment & Plan:  High-risk pregnancy: G2P0010 at 331w2dith an Estimated Date of Delivery: 01/03/21   1) IUGR -continue twice weekly testing, next appt 9/15 -Plan for delivery 38-39wks  2) Anxiety -on buspar without issues  - GBS/GC/C obtained today  Meds: No orders of the defined types were placed in this encounter.   Labs/procedures today: GBS, GC/C, NST  completed  Treatment Plan:  continue with plan as outlined above  Reviewed: Preterm labor symptoms and general obstetric precautions including but not limited to vaginal bleeding, contractions, leaking of fluid and fetal movement were reviewed in detail with the patient.  All questions were answered. Pt has home bp cuff. Check bp weekly, let us know if >140/90.   Follow-up: Return for HROB- continue as  scheduled.   Future Appointments  Date Time Provider [place]  12/11/2020  8:30 AM [facility] NURSE [facility]  12/15/2020  3:50 PM [facility] NURSE [facility]  12/18/2020  2:15 PM [place] - [facility]YN [place] [facility] None  12/18/2020  3:10 PM Patriciaann Clan, DO [facility]    Orders Placed This Encounter  Procedures   Culture, beta strep (group b only)    Janyth Pupa, DO Attending [place], [place] for [employer], [place]

## 2020-12-10 LAB — CERVICOVAGINAL ANCILLARY ONLY
Chlamydia: NEGATIVE
Comment: NEGATIVE
Comment: NORMAL
Neisseria Gonorrhea: NEGATIVE

## 2020-12-11 ENCOUNTER — Other Ambulatory Visit: Payer: Self-pay

## 2020-12-11 ENCOUNTER — Ambulatory Visit (INDEPENDENT_AMBULATORY_CARE_PROVIDER_SITE_OTHER): Payer: Medicaid Other | Admitting: *Deleted

## 2020-12-11 VITALS — BP 122/91 | HR 82 | Wt 126.6 lb

## 2020-12-11 DIAGNOSIS — O0993 Supervision of high risk pregnancy, unspecified, third trimester: Secondary | ICD-10-CM | POA: Diagnosis not present

## 2020-12-11 DIAGNOSIS — Z1389 Encounter for screening for other disorder: Secondary | ICD-10-CM

## 2020-12-11 DIAGNOSIS — Z3A36 36 weeks gestation of pregnancy: Secondary | ICD-10-CM | POA: Diagnosis not present

## 2020-12-11 LAB — POCT URINALYSIS DIPSTICK OB
Blood, UA: NEGATIVE
Glucose, UA: NEGATIVE
Ketones, UA: NEGATIVE
Nitrite, UA: NEGATIVE
POC,PROTEIN,UA: NEGATIVE

## 2020-12-11 NOTE — Progress Notes (Addendum)
   NURSE VISIT- NST  SUBJECTIVE:  Sydney Higgins is a 25 y.o. G2P0010 female at [redacted]w[redacted]d here for a NST for pregnancy complicated by FGR.  She reports active fetal movement, contractions: irregular, every 5-10 minutes, vaginal bleeding: none, membranes: intact. Requesting cervical exam due to contractions.  OBJECTIVE:  BP (!) 122/91   Pulse 82   Wt 126 lb 9.6 oz (57.4 kg)   LMP 03/29/2020   BMI 24.72 kg/m   Appears well, no apparent distress  Results for orders placed or performed in visit on 12/11/20 (from the past 24 hour(s))  POC Urinalysis Dipstick OB   Collection Time: 12/11/20  8:51 AM  Result Value Ref Range   Color, UA     Clarity, UA     Glucose, UA Negative Negative   Bilirubin, UA     Ketones, UA neg    Spec Grav, UA     Blood, UA neg    pH, UA     POC,PROTEIN,UA Negative Negative, Trace, Small (1+), Moderate (2+), Large (3+), 4+   Urobilinogen, UA     Nitrite, UA neg    Leukocytes, UA Small (1+) (A) Negative   Appearance     Odor      NST: FHR baseline 140 bpm, Variability: moderate, Accelerations:present, Decelerations:  Absent= Cat 1/reactive Toco: irregular, every 2-5 minutes   ASSESSMENT: G2P0010 at 352w5dith FGR NST reactive Cervix 2/50/-2 performed by FCD  PLAN: EFM strip reviewed by FrNigel BertholdCNM   Recommendations: keep next appointment as scheduled. Labor signs discussed.   LaAlice Rieger9/15/2022 9:37 AM Chart reviewed for nurse visit. Agree with plan of care.  CrChristin FudgeCNM 12/16/2020 8:20 AM

## 2020-12-12 LAB — CULTURE, BETA STREP (GROUP B ONLY): Strep Gp B Culture: NEGATIVE

## 2020-12-13 ENCOUNTER — Encounter (HOSPITAL_COMMUNITY): Payer: Self-pay | Admitting: Emergency Medicine

## 2020-12-13 ENCOUNTER — Inpatient Hospital Stay (HOSPITAL_COMMUNITY)
Admission: AD | Admit: 2020-12-13 | Discharge: 2020-12-16 | DRG: 806 | Disposition: A | Discharge status: Home / Self Care | Payer: Medicaid Other | Attending: Obstetrics & Gynecology | Admitting: Obstetrics & Gynecology

## 2020-12-13 ENCOUNTER — Other Ambulatory Visit: Payer: Self-pay

## 2020-12-13 DIAGNOSIS — Z3A37 37 weeks gestation of pregnancy: Secondary | ICD-10-CM | POA: Diagnosis not present

## 2020-12-13 DIAGNOSIS — D563 Thalassemia minor: Secondary | ICD-10-CM | POA: Diagnosis present

## 2020-12-13 DIAGNOSIS — O365930 Maternal care for other known or suspected poor fetal growth, third trimester, not applicable or unspecified: Secondary | ICD-10-CM | POA: Diagnosis present

## 2020-12-13 DIAGNOSIS — O4202 Full-term premature rupture of membranes, onset of labor within 24 hours of rupture: Secondary | ICD-10-CM | POA: Diagnosis not present

## 2020-12-13 DIAGNOSIS — O0993 Supervision of high risk pregnancy, unspecified, third trimester: Secondary | ICD-10-CM

## 2020-12-13 DIAGNOSIS — F1290 Cannabis use, unspecified, uncomplicated: Secondary | ICD-10-CM | POA: Diagnosis present

## 2020-12-13 DIAGNOSIS — O0990 Supervision of high risk pregnancy, unspecified, unspecified trimester: Secondary | ICD-10-CM

## 2020-12-13 DIAGNOSIS — O99324 Drug use complicating childbirth: Secondary | ICD-10-CM | POA: Diagnosis present

## 2020-12-13 DIAGNOSIS — Z20822 Contact with and (suspected) exposure to covid-19: Secondary | ICD-10-CM | POA: Diagnosis present

## 2020-12-13 DIAGNOSIS — Z87891 Personal history of nicotine dependence: Secondary | ICD-10-CM

## 2020-12-13 DIAGNOSIS — O4292 Full-term premature rupture of membranes, unspecified as to length of time between rupture and onset of labor: Secondary | ICD-10-CM | POA: Diagnosis not present

## 2020-12-13 DIAGNOSIS — O26893 Other specified pregnancy related conditions, third trimester: Secondary | ICD-10-CM | POA: Diagnosis not present

## 2020-12-13 DIAGNOSIS — O285 Abnormal chromosomal and genetic finding on antenatal screening of mother: Secondary | ICD-10-CM | POA: Diagnosis present

## 2020-12-13 DIAGNOSIS — Z720 Tobacco use: Secondary | ICD-10-CM

## 2020-12-13 LAB — CBC WITH DIFFERENTIAL/PLATELET
Abs Immature Granulocytes: 0.12 10*3/uL — ABNORMAL HIGH (ref 0.00–0.07)
Basophils Absolute: 0.1 10*3/uL (ref 0.0–0.1)
Basophils Relative: 1 %
Eosinophils Absolute: 0.1 10*3/uL (ref 0.0–0.5)
Eosinophils Relative: 1 %
HCT: 34.5 % — ABNORMAL LOW (ref 36.0–46.0)
Hemoglobin: 11.4 g/dL — ABNORMAL LOW (ref 12.0–15.0)
Immature Granulocytes: 1 %
Lymphocytes Relative: 16 %
Lymphs Abs: 1.9 10*3/uL (ref 0.7–4.0)
MCH: 29.1 pg (ref 26.0–34.0)
MCHC: 33 g/dL (ref 30.0–36.0)
MCV: 88 fL (ref 80.0–100.0)
Monocytes Absolute: 0.9 10*3/uL (ref 0.1–1.0)
Monocytes Relative: 7 %
Neutro Abs: 9 10*3/uL — ABNORMAL HIGH (ref 1.7–7.7)
Neutrophils Relative %: 74 %
Platelets: 188 10*3/uL (ref 150–400)
RBC: 3.92 MIL/uL (ref 3.87–5.11)
RDW: 14.2 % (ref 11.5–15.5)
WBC: 12.1 10*3/uL — ABNORMAL HIGH (ref 4.0–10.5)
nRBC: 0 % (ref 0.0–0.2)

## 2020-12-13 LAB — COMPREHENSIVE METABOLIC PANEL
ALT: 19 U/L (ref 0–44)
AST: 25 U/L (ref 15–41)
Albumin: 3.5 g/dL (ref 3.5–5.0)
Alkaline Phosphatase: 159 U/L — ABNORMAL HIGH (ref 38–126)
Anion gap: 5 (ref 5–15)
BUN: 8 mg/dL (ref 6–20)
CO2: 21 mmol/L — ABNORMAL LOW (ref 22–32)
Calcium: 8.4 mg/dL — ABNORMAL LOW (ref 8.9–10.3)
Chloride: 105 mmol/L (ref 98–111)
Creatinine, Ser: 0.68 mg/dL (ref 0.44–1.00)
GFR, Estimated: >60 mL/min (ref >60)
Glucose, Bld: 87 mg/dL (ref 70–99)
Potassium: 3.6 mmol/L (ref 3.5–5.1)
Sodium: 131 mmol/L — ABNORMAL LOW (ref 135–145)
Total Bilirubin: 0.4 mg/dL (ref 0.3–1.2)
Total Protein: 7.4 g/dL (ref 6.5–8.1)

## 2020-12-13 LAB — RESP PANEL BY RT-PCR (FLU A&B, COVID) ARPGX2
Influenza A by PCR: NEGATIVE
Influenza B by PCR: NEGATIVE
SARS Coronavirus 2 by RT PCR: NEGATIVE

## 2020-12-13 LAB — TYPE AND SCREEN
ABO/RH(D): B POS
ABO/RH(D): B POS
Antibody Screen: NEGATIVE
Antibody Screen: NEGATIVE

## 2020-12-13 LAB — PROTIME-INR
INR: 0.9 (ref 0.8–1.2)
Prothrombin Time: 12.1 seconds (ref 11.4–15.2)

## 2020-12-13 MED ORDER — BUSPIRONE HCL 5 MG PO TABS
5.0000 mg | ORAL_TABLET | Freq: Three times a day (TID) | ORAL | Status: DC
  Administered 2020-12-14 – 2020-12-16 (×7): 5 mg via ORAL
  Filled 2020-12-13 (×10): qty 1

## 2020-12-13 MED ORDER — OXYTOCIN-SODIUM CHLORIDE 30-0.9 UT/500ML-% IV SOLN
1.0000 m[IU]/min | INTRAVENOUS | Status: DC
Start: 2020-12-13 — End: 2020-12-14
  Administered 2020-12-13: 2 m[IU]/min via INTRAVENOUS

## 2020-12-13 MED ORDER — LACTATED RINGERS IV SOLN: INTRAVENOUS | Status: DC

## 2020-12-13 MED ORDER — ACETAMINOPHEN 325 MG PO TABS: 650.0000 mg | ORAL_TABLET | ORAL | Status: DC | PRN

## 2020-12-13 MED ORDER — OXYCODONE-ACETAMINOPHEN 5-325 MG PO TABS: 2.0000 | ORAL_TABLET | ORAL | Status: DC | PRN

## 2020-12-13 MED ORDER — SOD CITRATE-CITRIC ACID 500-334 MG/5ML PO SOLN: 30.0000 mL | ORAL | Status: DC | PRN

## 2020-12-13 MED ORDER — LACTATED RINGERS IV SOLN: 500.0000 mL | INTRAVENOUS | Status: DC | PRN

## 2020-12-13 MED ORDER — TERBUTALINE SULFATE 1 MG/ML IJ SOLN: 0.2500 mg | Freq: Once | INTRAMUSCULAR | Status: DC | PRN

## 2020-12-13 MED ORDER — OXYCODONE-ACETAMINOPHEN 5-325 MG PO TABS: 1.0000 | ORAL_TABLET | ORAL | Status: DC | PRN

## 2020-12-13 MED ORDER — OXYTOCIN BOLUS FROM INFUSION
333.0000 mL | Freq: Once | INTRAVENOUS | Status: AC
  Administered 2020-12-14: 333 mL via INTRAVENOUS

## 2020-12-13 MED ORDER — OXYTOCIN-SODIUM CHLORIDE 30-0.9 UT/500ML-% IV SOLN
2.5000 [IU]/h | INTRAVENOUS | Status: DC
  Filled 2020-12-13: qty 500

## 2020-12-13 MED ORDER — LIDOCAINE HCL (PF) 1 % IJ SOLN
30.0000 mL | INTRAMUSCULAR | Status: AC | PRN
  Administered 2020-12-14: 30 mL via SUBCUTANEOUS
  Filled 2020-12-13: qty 30

## 2020-12-13 MED ORDER — ONDANSETRON HCL 4 MG/2ML IJ SOLN: 4.0000 mg | Freq: Four times a day (QID) | INTRAMUSCULAR | Status: DC | PRN

## 2020-12-13 NOTE — ED Provider Notes (Signed)
[facility] EMERGENCY DEPARTMENT Provider Note   CSN: [id number] Arrival date & time: 12/13/20  1703     History Chief Complaint  Patient presents with   Contractions   Rupture of Membranes    Sydney Higgins is a 25 y.o. G1 P0 female [redacted] weeks pregnant who presents to the emergency department for evaluation of gush of fluid and contractions.  She states that she had a gush of fluid at approximately 1500 today and currently is endorsing pelvic pressure but not pelvic pain or contractions.  She has had prenatal care and baby has been in appropriate headdown position.  She arrives alert and oriented answering questions appropriately, hemodynamically stable.   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   02/19/2019>Repeat in 1 year per [facility],    UTI (urinary tract infection)     Patient Active Problem List   Diagnosis Date Noted   Indication for care in labor or delivery 12/13/2020   IUGR (intrauterine growth retardation) of newborn 12/02/2020   Abnormal chromosomal and genetic finding on antenatal screening mother 06/30/2020   Supervision of high-risk pregnancy 06/18/2020   Vapes nicotine containing substance 06/18/2020   Marijuana use 06/18/2020   MDD (major depressive disorder) 05/19/2019   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   Mass of left side of neck    Breast pain 01/11/2017   Constipation 12/17/2015   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   DMDD (disruptive mood dysregulation disorder) ([place]) 12/11/2015   Insomnia  12/11/2015    Past Surgical History:  Procedure Laterality Date   MASS EXCISION Left 08/26/2017   Procedure: EXCISION SUPERFICIAL MASS FROM LEFT SIDE OF NECK;  Surgeon: Virl Cagey, MD;  Location: [facility];  Service: General;  Laterality: Left;     OB History     Gravida  2   Para      Term      Preterm      AB  1   Living  0      SAB  1   IAB      Ectopic      Multiple      Live Births              Family History  Problem Relation Age of Onset   Cancer Paternal Grandfather    Cancer Paternal Grandmother    Diabetes Maternal Grandmother    Diabetes Maternal Grandfather    Hypertension Father    Bipolar disorder Brother    Deafness Sister    Diabetes Maternal Uncle     Social History   Tobacco Use   Smoking status: Former    Types: E-cigarettes   Smokeless tobacco: Never  Scientific laboratory technician Use: Former  Substance Use Topics   Alcohol use: Not Currently   Drug use: Yes    Types: Marijuana    Comment: occ    Home Medications Prior to Admission medications   Medication Sig Start Date End Date Taking? Authorizing  Provider  acetaminophen (TYLENOL) 500 MG tablet Take 500 mg by mouth every 6 (six) hours as needed for mild pain, fever or headache.    [provider]  busPIRone (BUSPAR) 5 MG tablet Take 1 tablet (5 mg total) by mouth 3 (three) times daily. 11/27/20 02/25/21  Janyth Pupa, DO  Prenatal Vit-Fe Fumarate-FA (MULTIVITAMIN-PRENATAL) 27-0.8 MG TABS tablet Take 1 tablet by mouth daily at 12 noon.    [provider]    Allergies    Apple, Banana, Bee venom, and Pear  Review of Systems   Review of Systems  Constitutional:  Negative for chills and fever.  HENT:  Negative for ear pain and sore throat.   Eyes:  Negative for pain and visual disturbance.  Respiratory:  Negative for cough and shortness of breath.   Cardiovascular:  Negative for chest pain and palpitations.  Gastrointestinal:  Positive for abdominal  pain. Negative for vomiting.  Genitourinary:  Positive for vaginal discharge. Negative for dysuria and hematuria.  Musculoskeletal:  Negative for arthralgias and back pain.  Skin:  Negative for color change and rash.  Neurological:  Negative for seizures and syncope.  All other systems reviewed and are negative.  Physical Exam Updated Vital Signs BP (!) 98/52   Pulse 68   Temp 99 F (37.2 C) (Axillary)   Resp 16   Ht 5' (1.524 m)   Wt 57.6 kg   LMP 03/29/2020   SpO2 97%   BMI 24.80 kg/m   Physical Exam Vitals and nursing note reviewed.  Constitutional:      General: She is not in acute distress.    Appearance: She is well-developed.  HENT:     Head: Normocephalic and atraumatic.  Eyes:     Conjunctiva/sclera: Conjunctivae normal.  Cardiovascular:     Rate and Rhythm: Normal rate and regular rhythm.     Heart sounds: No murmur heard. Pulmonary:     Effort: Pulmonary effort is normal. No respiratory distress.     Breath sounds: Normal breath sounds.  Abdominal:     Palpations: Abdomen is soft.     Tenderness: There is no abdominal tenderness.  Genitourinary:    Comments: Patient 8 cm dilated, 50% effaced Musculoskeletal:     Cervical back: Neck supple.  Skin:    General: Skin is warm and dry.  Neurological:     Mental Status: She is alert.    ED Results / Procedures / Treatments   Labs (all labs ordered are listed, but only abnormal results are displayed) Labs Reviewed  CBC WITH DIFFERENTIAL/PLATELET - Abnormal; Notable for the following components:      Result Value   WBC 12.1 (*)    Hemoglobin 11.4 (*)    HCT 34.5 (*)    Neutro Abs 9.0 (*)    Abs Immature Granulocytes 0.12 (*)    All other components within normal limits  COMPREHENSIVE METABOLIC PANEL - Abnormal; Notable for the following components:   Sodium 131 (*)    CO2 21 (*)    Calcium 8.4 (*)    Alkaline Phosphatase 159 (*)    All other components within normal limits  RESP PANEL BY RT-PCR  (FLU A&B, COVID) ARPGX2  PROTIME-INR  RPR  TYPE AND SCREEN  TYPE AND SCREEN    EKG None  Radiology No results found.  Procedures .Critical Care Performed by: Teressa Lower, MD Authorized by: Teressa Lower, MD   Critical care provider statement:    Critical care time (minutes):  30  Critical care was necessary to treat or prevent imminent or life-threatening deterioration of the following conditions: Active labor.   Critical care was time spent personally by me on the following activities:  Ordering and review of laboratory studies, discussions with consultants, discussions with primary provider and ordering and review of radiographic studies   Care discussed with: accepting provider at another facility     Medications Ordered in ED Medications  busPIRone (BUSPAR) tablet 5 mg (has no administration in time range)  lactated ringers infusion ( Intravenous New Bag/Given 12/13/20 1840)  oxytocin (PITOCIN) IV BOLUS FROM BAG (has no administration in time range)  oxytocin (PITOCIN) IV infusion 30 units in NS 500 mL - Premix (has no administration in time range)  lactated ringers infusion 500-1,000 mL (has no administration in time range)  acetaminophen (TYLENOL) tablet 650 mg (has no administration in time range)  oxyCODONE-acetaminophen (PERCOCET/ROXICET) 5-325 MG per tablet 1 tablet (has no administration in time range)  oxyCODONE-acetaminophen (PERCOCET/ROXICET) 5-325 MG per tablet 2 tablet (has no administration in time range)  ondansetron (ZOFRAN) injection 4 mg (has no administration in time range)  sodium citrate-citric acid (ORACIT) solution 30 mL (has no administration in time range)  lidocaine (PF) (XYLOCAINE) 1 % injection 30 mL (has no administration in time range)  terbutaline (BRETHINE) injection 0.25 mg (has no administration in time range)  oxytocin (PITOCIN) IV infusion 30 units in NS 500 mL - Premix (8 milli-units/min Intravenous Rate/Dose Change 12/13/20 2240)     ED Course  I have reviewed the triage vital signs and the nursing notes.  Pertinent labs & imaging results that were available during my care of the patient were reviewed by me and considered in my medical decision making (see chart for details).    MDM Rules/Calculators/A&P                           Patient seen the emergency department for evaluation of active labor.  Physical exam reveals an 8 cm dilated cervix, 50% effaced.  Fetal heart rate 151.  Laboratory evaluation with an hemoglobin of 11.4, leukocytosis 12.1 otherwise unremarkable.  Patient B positive.  EMS called and [facility].  Final Clinical Impression(s) / ED Diagnoses Final diagnoses:  Supervision of high risk pregnancy in third trimester  Vapes nicotine containing substance  Marijuana use  IUGR (intrauterine growth retardation) of newborn    Rx / DC Orders ED Discharge Orders     None        Romy Ipock, Debe Coder, MD 12/13/20 [phone]

## 2020-12-13 NOTE — H&P (Addendum)
OBSTETRIC ADMISSION HISTORY AND PHYSICAL  Sydney Higgins is a 25 y.o. female G2P0010 with IUP at 71w0dby LMP presenting for SROM. She reports +FMs, no LOF, no VB, no blurry vision, headaches, peripheral edema, or RUQ pain.  She plans on breast feeding. She request POPs for birth control.  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   Depression    DMDD (disruptive mood dysregulation disorder) ([place] 12/11/2015   Headache    Insomnia 12/11/2015   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   02/19/2019>Repeat in 1 year per [facility],    UTI (urinary tract infection)     Past Surgical History: Past Surgical History:  Procedure Laterality Date   MASS EXCISION Left 08/26/2017   Procedure: EXCISION SUPERFICIAL MASS FROM LEFT SIDE OF NECK;  Surgeon: BVirl Cagey MD;  Location: [facility];  Service: General;  Laterality: Left;    Obstetrical History: OB History     Gravida  2   Para      Term      Preterm      AB  1   Living  0      SAB  1   IAB      Ectopic      Multiple      Live Births              Social History Social History   Socioeconomic History   Marital status: Single    Spouse name: Not on file   Number of children: Not on file   Years of education: Not on file   Highest education level: Not on file  Occupational History   Not on file  Tobacco Use   Smoking status: Former    Types: E-cigarettes   Smokeless tobacco: Never  Vaping Use   Vaping Use: Every day  Substance and Sexual Activity   Alcohol use: Not Currently   Drug use: Yes     Types: Marijuana    Comment: occ   Sexual activity: Yes    Birth control/protection: None  Other Topics Concern   Not on file  Social History Narrative   Not on file   Social Determinants of Health   Financial Resource Strain: Low Risk    Difficulty of Paying Living Expenses: Not very hard  Food Insecurity: No Food Insecurity   Worried About RCharity fundraiserin the Last Year: Never true   RArboriculturistin the Last Year: Never true  Transportation Needs: Unmet Transportation Needs   Lack of Transportation (Medical): No   Lack of Transportation (Non-Medical): Yes  Physical Activity: Inactive   Days of Exercise per Week: 0 days   Minutes of Exercise per Session: 0 min  Stress: No Stress Concern Present   Feeling of Stress : Not at all  Social Connections: Socially Isolated   Frequency of Communication with Friends and Family: Twice a week   Frequency of Social Gatherings with Friends and Family: Never   Attends Religious Services: 1 to 4 times per year   Active  Member of Clubs or Organizations: No   Attends Music therapist: Never   Marital Status: Never married    Family History: Family History  Problem Relation Age of Onset   Cancer Paternal Grandfather    Cancer Paternal Grandmother    Diabetes Maternal Grandmother    Diabetes Maternal Grandfather    Hypertension Father    Bipolar disorder Brother    Deafness Sister    Diabetes Maternal Uncle     Allergies: Allergies  Allergen Reactions   Apple Other (See Comments)    Can eat the fruit, but not the peel Mouth itches   Banana Other (See Comments)    Pt prefrence   Bee Venom Swelling   Pear Other (See Comments)    Mouth itches    Medications Prior to Admission  Medication Sig Dispense Refill Last Dose   acetaminophen (TYLENOL) 500 MG tablet Take 500 mg by mouth every 6 (six) hours as needed for mild pain, fever or headache.      busPIRone (BUSPAR) 5 MG tablet Take 1 tablet (5 mg total) by  mouth 3 (three) times daily. 90 tablet 3    Prenatal Vit-Fe Fumarate-FA (MULTIVITAMIN-PRENATAL) 27-0.8 MG TABS tablet Take 1 tablet by mouth daily at 12 noon.        Review of Systems   All systems reviewed and negative except as stated in HPI  Blood pressure 125/87, pulse 93, temperature 100 F (37.8 C), temperature source Oral, resp. rate 16, height 5' (1.524 m), weight 57.6 kg, last menstrual period 03/29/2020, SpO2 97 %.  General appearance: alert, cooperative, appears stated age, and no distress Lungs: normal work of breathing, speaking in full sentences  Abdomen: soft, non-tender, bowel sounds normal Extremities: Homans sign is negative, no sign of DVT  Presentation: Cephalic by BSUS Fetal monitoring: Baseline: 120 bpm, Variability: Good {> 6 bpm), Accelerations: Reactive, and Decelerations: Absent Uterine activity: Frequency: Every 4-6 minutes Dilation: 2 Effacement (%): 60 Station: 0 Exam by:: caroline neill cnm   Prenatal labs: ABO, Rh: --/--/B POS (09/17 1719) Antibody: NEG (09/17 1719) Rubella: Immune (06/01 0000) RPR: Nonreactive (06/01 0000)  HBsAg: Negative (06/01 0000)  HIV: Non Reactive (09/01 1426)  GBS: Negative/-- (09/12 1700)  1 hr Glucola normal  Genetic screening - Low risk female; alpha thal carrier  Anatomy US normal; IUGR 3.7%  Prenatal Transfer Tool  Maternal Diabetes: No Genetic Screening: Abnormal:  Results: Other: Alpha thal carrier; otherwise low risk Maternal Ultrasounds/Referrals: IUGR Fetal Ultrasounds or other Referrals:  Referred to Materal Fetal Medicine  Maternal Substance Abuse:  Yes:  Type: Smoker, Marijuana Significant Maternal Medications:  Meds include: Other: Buspar  Significant Maternal Lab Results: Group B Strep negative  Results for orders placed or performed during the hospital encounter of 12/13/20 (from the past 24 hour(s))  CBC with Differential   Collection Time: 12/13/20  5:19 PM  Result Value Ref Range   WBC 12.1  (H) 4.0 - 10.5 K/uL   RBC 3.92 3.87 - 5.11 MIL/uL   Hemoglobin 11.4 (L) 12.0 - 15.0 g/dL   HCT 34.5 (L) 36.0 - 46.0 %   MCV 88.0 80.0 - 100.0 fL   MCH 29.1 26.0 - 34.0 pg   MCHC 33.0 30.0 - 36.0 g/dL   RDW 14.2 11.5 - 15.5 %   Platelets 188 150 - 400 K/uL   nRBC 0.0 0.0 - 0.2 %   Neutrophils Relative % 74 %   Neutro Abs 9.0 (H) 1.7 - 7.7 K/uL  Lymphocytes Relative 16 %   Lymphs Abs 1.9 0.7 - 4.0 K/uL   Monocytes Relative 7 %   Monocytes Absolute 0.9 0.1 - 1.0 K/uL   Eosinophils Relative 1 %   Eosinophils Absolute 0.1 0.0 - 0.5 K/uL   Basophils Relative 1 %   Basophils Absolute 0.1 0.0 - 0.1 K/uL   Immature Granulocytes 1 %   Abs Immature Granulocytes 0.12 (H) 0.00 - 0.07 K/uL  Protime-INR   Collection Time: 12/13/20  5:19 PM  Result Value Ref Range   Prothrombin Time 12.1 11.4 - 15.2 seconds   INR 0.9 0.8 - 1.2  Comprehensive metabolic panel   Collection Time: 12/13/20  5:19 PM  Result Value Ref Range   Sodium 131 (L) 135 - 145 mmol/L   Potassium 3.6 3.5 - 5.1 mmol/L   Chloride 105 98 - 111 mmol/L   CO2 21 (L) 22 - 32 mmol/L   Glucose, Bld 87 70 - 99 mg/dL   BUN 8 6 - 20 mg/dL   Creatinine, Ser 0.68 0.44 - 1.00 mg/dL   Calcium 8.4 (L) 8.9 - 10.3 mg/dL   Total Protein 7.4 6.5 - 8.1 g/dL   Albumin 3.5 3.5 - 5.0 g/dL   AST 25 15 - 41 U/L   ALT 19 0 - 44 U/L   Alkaline Phosphatase 159 (H) 38 - 126 U/L   Total Bilirubin 0.4 0.3 - 1.2 mg/dL   GFR, Estimated >60 >60 mL/min   Anion gap 5 5 - 15  Type and screen [facility]   Collection Time: 12/13/20  5:19 PM  Result Value Ref Range   ABO/RH(D) B POS    Antibody Screen NEG    Sample Expiration      12/16/2020,2359 Performed at [facility], [address] [place], [place] [address]   Resp Panel by RT-PCR (Flu A&B, Covid) Nasopharyngeal Swab   Collection Time: 12/13/20  5:20 PM   Specimen: Nasopharyngeal Swab; Nasopharyngeal(NP) swabs in vial transport medium  Result Value Ref Range   SARS Coronavirus 2 by  RT PCR NEGATIVE NEGATIVE   Influenza A by PCR NEGATIVE NEGATIVE   Influenza B by PCR NEGATIVE NEGATIVE    Patient Active Problem List   Diagnosis Date Noted   IUGR (intrauterine growth retardation) of newborn 12/02/2020   Abnormal chromosomal and genetic finding on antenatal screening mother 06/30/2020   Supervision of high-risk pregnancy 06/18/2020   Vapes nicotine containing substance 06/18/2020   Marijuana use 06/18/2020   MDD (major depressive disorder) 05/19/2019   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 G2P0010 at 34w0dhere for SROM.   #Labor: Patient presents after SROM.  She is having contractions every 4-6 minutes and her cervical exam is unchanged from office visit 3 days ago.  Will initiate Pitocin 2 x 2 and monitor. #Pain: IV pain medicine as well as nitrous #FWB: Category 1, reassuring #ID:  GBS negative #MOF: Breast #MOC: POPs #IUGR: Patient's fetus 3.7% 1932 g  VGifford Shave MD  12/13/2020, 6:16 PM  GME ATTESTATION:  I saw and evaluated the patient. I agree with the findings and the plan of care as documented in the resident's note. I  have made changes to documentation as necessary.  Vilma Meckel, MD OB Fellow, [place] [facility] [place] 12/13/2020 7:27 PM

## 2020-12-13 NOTE — ED Notes (Signed)
Babys heart rate noted to be 151bpm

## 2020-12-13 NOTE — MAU Note (Signed)
Sydney Higgins is a 25 y.o. at 26w0dhere in MAU reporting: contractions and LOF. Leaking started around 1500 and was a little trickle. No bleeding. +FM  Onset of complaint: today  Pain score: 5/10  Vitals:   12/13/20 1808  BP: 125/87  Pulse: 93  Resp: 16  Temp: 100 F (37.8 C)  SpO2: 97%     FHT: EFM applied in room  Lab orders placed from triage: none

## 2020-12-13 NOTE — ED Notes (Signed)
EMS is here to transport pt.

## 2020-12-13 NOTE — ED Triage Notes (Signed)
Pt arrived w/ water ruptured @ home around 1500 today. Per MD pt is 8cm dilated. Pt c/o of pelvic pressure

## 2020-12-13 NOTE — Progress Notes (Signed)
CNM met EMS upon arrival due to report of patient being 8cm dilated. Patient comfortable on stretcher. Patient grossly ruptured with clear fluid. Cervix 2cm/60/0.   Report called to labor team. Will admit to labor and delivery.  Wende Mott, CNM 12/13/20 6:12 PM

## 2020-12-13 NOTE — Progress Notes (Signed)
Sydney Higgins is a 25 y.o. G2P0010 at [redacted]w[redacted]d admitted for PROM  Subjective: Sitting in bed. Feeling contractions more. When needing pain control would like to do nitrous and IV pain medicines Objective: BP 119/78   Pulse 74   Temp 99 F (37.2 C) (Axillary)   Resp 18   Ht 5' (1.524 m)   Wt 57.6 kg   LMP 03/29/2020   SpO2 97%   BMI 24.80 kg/m  No intake/output data recorded. No intake/output data recorded.  FHT:  FHR: 130 bpm, variability: moderate,  accelerations:  Present,  decelerations:  Absent UC:   regular, every 1-3 minutes SVE:   Dilation: 2 Effacement (%): 60 Station: 0 Exam by:: caroline neill cnm  Labs: Lab Results  Component Value Date   WBC 12.1 (H) 12/13/2020   HGB 11.4 (L) 12/13/2020   HCT 34.5 (L) 12/13/2020   MCV 88.0 12/13/2020   PLT 188 12/13/2020    Assessment / Plan: Induction of labor due to PROM on pitocin  Labor: Progressing on Pitocin, will continue to increase and plan to check in 2 hours after pitocin started Fetal Wellbeing:  Category I Pain Control:  IV pain meds and Nitrous Oxide I/D:   GBS negative Anticipated MOD:  NSVD  ARenard Matter9/17/2022, 8:51 PM

## 2020-12-14 ENCOUNTER — Encounter (HOSPITAL_COMMUNITY): Payer: Self-pay | Admitting: Obstetrics & Gynecology

## 2020-12-14 DIAGNOSIS — O365930 Maternal care for other known or suspected poor fetal growth, third trimester, not applicable or unspecified: Secondary | ICD-10-CM

## 2020-12-14 DIAGNOSIS — Z3A37 37 weeks gestation of pregnancy: Secondary | ICD-10-CM

## 2020-12-14 DIAGNOSIS — O4202 Full-term premature rupture of membranes, onset of labor within 24 hours of rupture: Secondary | ICD-10-CM

## 2020-12-14 DIAGNOSIS — O99324 Drug use complicating childbirth: Secondary | ICD-10-CM

## 2020-12-14 LAB — RPR: RPR Ser Ql: NONREACTIVE

## 2020-12-14 MED ORDER — MEASLES, MUMPS & RUBELLA VAC IJ SOLR: 0.5000 mL | Freq: Once | INTRAMUSCULAR | Status: DC

## 2020-12-14 MED ORDER — BENZOCAINE-MENTHOL 20-0.5 % EX AERO
1.0000 "application " | INHALATION_SPRAY | CUTANEOUS | Status: DC | PRN
  Administered 2020-12-14: 1 via TOPICAL
  Filled 2020-12-14: qty 56

## 2020-12-14 MED ORDER — ONDANSETRON HCL 4 MG/2ML IJ SOLN: 4.0000 mg | INTRAMUSCULAR | Status: DC | PRN

## 2020-12-14 MED ORDER — DIBUCAINE (PERIANAL) 1 % EX OINT: 1.0000 "application " | TOPICAL_OINTMENT | CUTANEOUS | Status: DC | PRN

## 2020-12-14 MED ORDER — EPHEDRINE 5 MG/ML INJ: 10.0000 mg | INTRAVENOUS | Status: DC | PRN

## 2020-12-14 MED ORDER — PRENATAL MULTIVITAMIN CH
1.0000 | ORAL_TABLET | Freq: Every day | ORAL | Status: DC
  Administered 2020-12-14 – 2020-12-16 (×3): 1 via ORAL
  Filled 2020-12-14 (×3): qty 1

## 2020-12-14 MED ORDER — FENTANYL CITRATE (PF) 100 MCG/2ML IJ SOLN
INTRAMUSCULAR | Status: AC
  Filled 2020-12-14: qty 2

## 2020-12-14 MED ORDER — SIMETHICONE 80 MG PO CHEW: 80.0000 mg | CHEWABLE_TABLET | ORAL | Status: DC | PRN

## 2020-12-14 MED ORDER — TETANUS-DIPHTH-ACELL PERTUSSIS 5-2.5-18.5 LF-MCG/0.5 IM SUSY: 0.5000 mL | PREFILLED_SYRINGE | Freq: Once | INTRAMUSCULAR | Status: DC

## 2020-12-14 MED ORDER — FENTANYL CITRATE (PF) 100 MCG/2ML IJ SOLN
50.0000 ug | INTRAMUSCULAR | Status: DC | PRN
  Administered 2020-12-14: 100 ug via INTRAVENOUS
  Administered 2020-12-14: 50 ug via INTRAVENOUS
  Filled 2020-12-14: qty 2

## 2020-12-14 MED ORDER — WITCH HAZEL-GLYCERIN EX PADS: 1.0000 "application " | MEDICATED_PAD | CUTANEOUS | Status: DC | PRN

## 2020-12-14 MED ORDER — PHENYLEPHRINE 40 MCG/ML (10ML) SYRINGE FOR IV PUSH (FOR BLOOD PRESSURE SUPPORT): 80.0000 ug | PREFILLED_SYRINGE | INTRAVENOUS | Status: DC | PRN

## 2020-12-14 MED ORDER — SENNOSIDES-DOCUSATE SODIUM 8.6-50 MG PO TABS
2.0000 | ORAL_TABLET | Freq: Every day | ORAL | Status: DC
  Administered 2020-12-15 – 2020-12-16 (×2): 2 via ORAL
  Filled 2020-12-14 (×2): qty 2

## 2020-12-14 MED ORDER — ACETAMINOPHEN 325 MG PO TABS
650.0000 mg | ORAL_TABLET | ORAL | Status: DC | PRN
  Administered 2020-12-14 – 2020-12-15 (×2): 650 mg via ORAL
  Filled 2020-12-14 (×2): qty 2

## 2020-12-14 MED ORDER — DIPHENHYDRAMINE HCL 50 MG/ML IJ SOLN
12.5000 mg | INTRAMUSCULAR | Status: DC | PRN
Start: 2020-12-14 — End: 2020-12-14

## 2020-12-14 MED ORDER — DIPHENHYDRAMINE HCL 25 MG PO CAPS: 25.0000 mg | ORAL_CAPSULE | Freq: Four times a day (QID) | ORAL | Status: DC | PRN

## 2020-12-14 MED ORDER — COCONUT OIL OIL: 1.0000 "application " | TOPICAL_OIL | Status: DC | PRN

## 2020-12-14 MED ORDER — FENTANYL-BUPIVACAINE-NACL 0.5-0.125-0.9 MG/250ML-% EP SOLN: 12.0000 mL/h | EPIDURAL | Status: DC | PRN

## 2020-12-14 MED ORDER — ONDANSETRON HCL 4 MG PO TABS: 4.0000 mg | ORAL_TABLET | ORAL | Status: DC | PRN

## 2020-12-14 MED ORDER — LACTATED RINGERS IV SOLN: 500.0000 mL | Freq: Once | INTRAVENOUS | Status: DC

## 2020-12-14 MED ORDER — IBUPROFEN 600 MG PO TABS
600.0000 mg | ORAL_TABLET | Freq: Four times a day (QID) | ORAL | Status: DC
  Administered 2020-12-14 – 2020-12-16 (×10): 600 mg via ORAL
  Filled 2020-12-14 (×10): qty 1

## 2020-12-14 MED ORDER — MEDROXYPROGESTERONE ACETATE 150 MG/ML IM SUSP: 150.0000 mg | INTRAMUSCULAR | Status: DC | PRN

## 2020-12-14 NOTE — Lactation Note (Signed)
This note was copied from a baby's chart. Lactation Consultation Note  Patient Name: Sydney Higgins Today's Date: 12/14/2020 Reason for consult: Initial assessment;1st time breastfeeding;Early term 37-38.6wks;Infant < 6lbs Age:23 hours  P1, < 5 lbs.  [redacted]w[redacted]d   Mother pumping upon entering. Gave baby approx 1 ml of colostrum via curved tip syringe.  Baby would not take more. Reviewed volume guidelines for supplementation. [facility] [place] [facility] demand with cues.  Goal 8-12+ times per day after first 24 hrs.  Place baby STS if not cueing.  Mom made aware of O/P services, breastfeeding support groups, community resources, and our phone # for post-discharge questions.  Discussed paced feeding and provided mother with extra slow flow nipples. Will be reviewed later once baby is awake.    Maternal Data Does the patient have breastfeeding experience prior to this delivery?: No  Feeding Mother's Current Feeding Choice: Breast Milk and Formula Nipple Type: Slow - flow   Lactation Tools Discussed/Used Tools: Pump Breast pump type: Double-Electric Breast Pump Pump Education: Setup, frequency, and cleaning;Milk Storage Reason for Pumping: stimulation and supplemenation Pumping frequency: q 3 hours Pumped volume: 2 mL  Interventions Interventions: Education;DEBP, paced feeding  Discharge WIC Program: Yes  Consult Status Consult Status: Follow-up Date: 12/14/20 Follow-up type: In-patient    BVivianne MasterBSumma Rehab Hospital9/18/2022, 9:06 AM

## 2020-12-14 NOTE — Progress Notes (Signed)
Sydney Higgins is a 25 y.o. G2P0010 at [redacted]w[redacted]d admitted for PROM  Subjective: Patient starting to feel contractions more  Objective: BP 134/82   Pulse (!) 57   Temp 98.7 F (37.1 C) (Oral)   Resp 16   Ht 5' (1.524 m)   Wt 57.6 kg   LMP 03/29/2020   SpO2 97%   BMI 24.80 kg/m  No intake/output data recorded. No intake/output data recorded.  FHT:  FHR: 130 bpm, variability: moderate,  accelerations:  Present,  decelerations:  Absent UC:   regular, every 1-3 minutes SVE:   Dilation: 4 Effacement (%): 70 Station: 0 Exam by:: Dr DCy Blamer Labs: Lab Results  Component Value Date   WBC 12.1 (H) 12/13/2020   HGB 11.4 (L) 12/13/2020   HCT 34.5 (L) 12/13/2020   MCV 88.0 12/13/2020   PLT 188 12/13/2020    Assessment / Plan: Induction of labor due to PROM,  progressing well on pitocin  Labor: Progressing on Pitocin, will continue to increase.  Fetal Wellbeing:  Category I Pain Control:  IV pain meds and Nitrous Oxide I/D:   GBS negative Anticipated MOD:  NSVD  ARenard Matter9/18/2022, 1:06 AM

## 2020-12-14 NOTE — Discharge Summary (Addendum)
Postpartum Discharge Summary  Patient Name: Sydney Higgins DOB: 11-29-1995 MRN: [id number]  Date of admission: 12/13/2020 Delivery date:12/14/2020  Delivering provider: Renard Matter  Date of discharge: 12/16/2020  Admitting diagnosis: Indication for care in labor or delivery [O75.9] Intrauterine pregnancy: [redacted]w[redacted]d    Secondary diagnosis:  Active Problems:   Supervision of high-risk pregnancy   Abnormal chromosomal and genetic finding on antenatal screening mother   IUGR (intrauterine growth retardation) of newborn   Indication for care in labor or delivery   Vaginal delivery  Additional problems: None    Discharge diagnosis: Term Pregnancy Delivered                                              Post partum procedures: None Augmentation: Pitocin Complications: None  Hospital course: Induction of Labor With Vaginal Delivery   25y.o. yo G2P0010 at 322w1das admitted to the hospital 12/13/2020 for induction of labor.  Indication for induction: PROM.  Patient had an uncomplicated labor course as follows: Membrane Rupture Time/Date: 3:00 PM ,12/13/2020   Delivery Method:Vaginal, Spontaneous  Episiotomy: None  Lacerations:  1st degree;Periurethral  Details of delivery can be found in separate delivery note.  Patient had a routine postpartum course. Patient is discharged home 12/16/20.  Newborn Data: Birth date:12/14/2020  Birth time:3:11 AM  Gender:Female  Living status:Living  Apgars:8 ,9  [phone]   Magnesium Sulfate received: No BMZ received: No Rhophylac:N/A MMR:N/A T-DaP:Given prenatally Flu: N/A Transfusion:No  Physical exam  Vitals:   12/15/20 0508 12/15/20 1430 12/15/20 2024 12/16/20 0643  BP: 122/88 133/77 122/80 111/78  Pulse: (!) 52 74 73 66  Resp: _0 Temp: 98.8 F (37.1 C) 98.4 F (36.9 C) 99.1 F (37.3 C) 98.2 F (36.8 C)  TempSrc: Oral Oral Oral Oral  SpO2: 100% 100% 100%   Weight:      Height:       General: alert, cooperative, and no  distress Lochia: appropriate Uterine Fundus: firm DVT Evaluation: No evidence of DVT seen on physical exam. Labs: Lab Results  Component Value Date   WBC 12.1 (H) 12/13/2020   HGB 11.4 (L) 12/13/2020   HCT 34.5 (L) 12/13/2020   MCV 88.0 12/13/2020   PLT 188 12/13/2020   CMP Latest Ref Rng & Units 12/13/2020  Glucose 70 - 99 mg/dL 87  BUN 6 - 20 mg/dL 8  Creatinine 0.44 - 1.00 mg/dL 0.68  Sodium 135 - 145 mmol/L 131(L)  Potassium 3.5 - 5.1 mmol/L 3.6  Chloride 98 - 111 mmol/L 105  CO2 22 - 32 mmol/L 21(L)  Calcium 8.9 - 10.3 mg/dL 8.4(L)  Total Protein 6.5 - 8.1 g/dL 7.4  Total Bilirubin 0.3 - 1.2 mg/dL 0.4  Alkaline Phos 38 - 126 U/L 159(H)  AST 15 - 41 U/L 25  ALT 0 - 44 U/L 19   Edinburgh Score: Edinburgh Postnatal Depression Scale Screening Tool 12/14/2020  I have been able to laugh and see the funny side of things. 0  I have looked forward with enjoyment to things. 0  I have blamed myself unnecessarily when things went wrong. 3  I have been anxious or worried for no good reason. 2  I have felt scared or panicky for no good reason. 2  Things have been getting on top of me. 2  I have been so  unhappy that I have had difficulty sleeping. 1  I have felt sad or miserable. 1  I have been so unhappy that I have been crying. 1  The thought of harming myself has occurred to me. 1  Edinburgh Postnatal Depression Scale Total 13     After visit meds:  Allergies as of 12/16/2020       Reactions   Apple Other (See Comments)   Can eat the fruit, but not the peel Mouth itches   Banana Other (See Comments)   Pt prefrence   Bee Venom Swelling   Pear Other (See Comments)   Mouth itches        Medication List     TAKE these medications    acetaminophen 325 MG tablet Commonly known as: Tylenol Take 2 tablets (650 mg total) by mouth every 4 (four) hours as needed (for pain scale < 4). What changed:  medication strength how much to take when to take this reasons to  take this   busPIRone 5 MG tablet Commonly known as: BUSPAR Take 1 tablet (5 mg total) by mouth 3 (three) times daily.   ibuprofen 200 MG tablet Commonly known as: ADVIL Take 3 tablets (600 mg total) by mouth every 6 (six) hours.   multivitamin-prenatal 27-0.8 MG Tabs tablet Take 1 tablet by mouth daily at 12 noon.   norethindrone 0.35 MG tablet Commonly known as: Ortho Micronor Take 1 tablet (0.35 mg total) by mouth daily.         Discharge home in stable condition Infant Feeding: Breast Infant Disposition:home with mother Discharge instruction: per After Visit Summary and Postpartum booklet. Activity: Advance as tolerated. Pelvic rest for 6 weeks.  Diet: routine diet  Future Appointments: Future Appointments  Date Time Provider [place]  01/06/2021  2:50 PM Florian Buff, MD [facility]   Follow up Visit: Message sent to [facility] by Dr. Cy Blamer on 9/18 Please schedule this patient for a In person postpartum visit in 4 weeks with the following provider: Any provider. Additional Postpartum F/U:Postpartum Depression checkup  Low risk pregnancy complicated by:  FGR  Delivery mode:  Vaginal, Spontaneous  Anticipated Birth Control:  POPs   12/16/2020 Pearla Dubonnet, MD

## 2020-12-14 NOTE — Progress Notes (Signed)
   12/14/20 1351  Edinburgh Postnatal Depression Scale:  In the Past 7 Days  I have been able to laugh and see the funny side of things. 0  I have looked forward with enjoyment to things. 0  I have blamed myself unnecessarily when things went wrong. 3  I have been anxious or worried for no good reason. 2  I have felt scared or panicky for no good reason. 2  Things have been getting on top of me. 2  I have been so unhappy that I have had difficulty sleeping. 1  I have felt sad or miserable. 1  I have been so unhappy that I have been crying. 1  The thought of harming myself has occurred to me. 1  Edinburgh Postnatal Depression Scale Total (!) 18  Mom is bonding and acting appropriate with newborn. LC provided literature about compatibility w busbar, the medication she took throughout her pregnancy (L3). Mom stated she didn't need the medication now but was not committed to not taking it. RN suggested mom talk to MD about her medication & emotional state. Transitions to care order put in., Social worker notified

## 2020-12-15 ENCOUNTER — Other Ambulatory Visit: Payer: Medicaid Other

## 2020-12-15 NOTE — Clinical Social Work Maternal (Addendum)
CLINICAL SOCIAL WORK MATERNAL/CHILD NOTE  Patient Details  Name: Sydney Higgins MRN: [id number] Date of Birth: 04/14/1995  Date:  12/15/2020  Clinical Social Worker Initiating Note:  Latoiya Maradiaga, LCSW Date/Time: Initiated:  12/15/20/0305     Child's Name:  Sydney Higgins   Biological Parents:  Mother, Father (FOB-Alejandro Higgins)   Need for Interpreter:  None   Reason for Referral:  Behavioral Health Concerns, Current Substance Use/Substance Use During Pregnancy     Address:  [address] [place] [address]    Phone number:  [phone] (home)     Additional phone number:   Household Members/Support Persons (HM/SP):   Household Member/Support Person 1, Household Member/Support Person 2, Household Member/Support Person 3   HM/SP Name Relationship DOB or Age  HM/SP -1 Charlene Dwan Mother 04-27-1974  HM/SP -2 Larmaine Kanner Brother 10-10-1994  HM/SP -3 Tremain Lacko Brother 10-10-1994  HM/SP -4        HM/SP -5        HM/SP -6        HM/SP -7        HM/SP -8          Natural Supports (not living in the home):  Spouse/significant other, Extended Family   Professional Supports:     Employment: Unemployed   Type of Work:     Education:  Some College   Homebound arranged:    Financial Resources:  Medicaid   Other Resources:  Food Stamps  , WIC   Cultural/Religious Considerations Which May Impact Care:    Strengths:  Ability to meet basic needs  , Home prepared for child  , Psychotropic Medications   Psychotropic Medications:  Buspar      Pediatrician:       Pediatrician List:   [place]    [place]    [place]    [place]    [place]    [place]      Pediatrician Fax Number:    Risk Factors/Current Problems:  Substance Use     Cognitive State:  Able to Concentrate  , Alert  , Linear Thinking  , Insightful     Mood/Affect:  Happy  , Relaxed  , Calm  , Comfortable     CSW Assessment: CSW  received consult for [place] 13 answer 1 on #10, THC and has a history of depression and anxiety. CSW met with MOB to offer support and complete assessment.    CSW met with MOB at bedside. CSW congratulated MOB and introduced CSW role. CSW observed MOB siting on the couch, holding the infant. MOB presented calm and receptive to CSW visit. MOB confirmed the demographic information on file is correct and stated she lives with her mother and two brothers. MOB shred FOB is currently in [place] and could not get here in time for the birth however he has been supportive via video calls. CSW inquired how MOB has felt since giving birth. MOB expressed feeling sore but excited the baby is here. CSW inquired how MOB felt throughout the pregnancy. MOB reported feeling down at times and happy other times. 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 She has always felt like she had depression and anxiety but was officially diagnosed in 2017. MOB shared she currently takes Buspar that help her symptoms however she plans to talk to her physician about switching to a medication that is safe to take while breastfeeding.   MOB reported she had therapy as a teenager but not as an adult. MOB expressed feeling happy she has not experienced post-partum depression/anxiety symptoms but aware that it can present anytime. CSW discussed [place]. MOB shared she had passive SI thoughts about a month ago due to stress. MOB denied having plan and acting on her thoughts.  CSW assessed MOB for safety. MOB denied thoughts of harm to self and others. CSW inquired about MOB supports. MOB identified FOB, her mom and brother as supports. CSW provided education regarding the baby blues period vs. perinatal mood disorders, discussed treatment and gave resources for mental health follow up if concerns arise.  CSW recommended self-evaluation during the postpartum time period using the  New Mom Checklist from Postpartum Progress and encouraged MOB to contact a medical professional if symptoms are noted at any time.  MOB reported she feels comfortable reaching out to her doctor if she has concerns.CSW asked MOB about substance use during pregnancy. MOB disclosed she used THC during her first trimester for nausea and vomiting symptoms. MOB denied using other substances. CSW informed MOB of the hospital drug screen policy and that CSW will follow infant's CDS. MOB reported understanding and had no concerns.   CSW provided review of Sudden Infant Death Syndrome (SIDS) precautions. MOB shared she has essential items for the infant including a pack n play where the infant will sleep. MOB is still deciding on a pediatrician for the infant. MOB shared she receives both WIC/FS benefits. MOB presented questions about housing reduces in [place] because she is hoping to relocate. CSW to provide housing resources. CSW assessed MOB for additional needs. MOB reported no further need.   -CSW will follow infant's CDS and make a report to CPS, if warranted.  CSW identifies no further need for intervention and no barriers to discharge at this time.     CSW Plan/Description:  CSW Will Continue to Monitor Umbilical Cord Tissue Drug Screen Results and Make Report if Warranted, Sudden Infant Death Syndrome (SIDS) Education, Other Information/Referral to Community Resources, Hospital Drug Screen Policy Information, No Further Intervention Required/No Barriers to Discharge, Perinatal Mood and Anxiety Disorder (PMADs) Education    Azeem Poorman A Tashonda Pinkus, LCSW 12/15/2020, 2:15 PM 

## 2020-12-15 NOTE — Progress Notes (Addendum)
POSTPARTUM PROGRESS NOTE  Post Partum Day 1  Subjective:  Sydney Higgins is a 25 y.o. [id number] PPD#1 s/p SVD  at [redacted]w[redacted]d.  No acute events overnight.  Pt denies problems with ambulating, voiding or po intake.  She denies nausea or vomiting.  Pain is well controlled.  She has had flatus. She has not had bowel movement.  Lochia Minimal.   She reports she would like to go home if baby is discharged today. If not, she would like to stay another day  Objective: Blood pressure 122/88, pulse (!) 52, temperature 98.8 F (37.1 C), temperature source Oral, resp. rate 18, height 5' (1.524 m), weight 127 lb (57.6 kg), last menstrual period 03/29/2020, SpO2 100 %, unknown if currently breastfeeding.  Physical Exam:  General: alert, cooperative and no distress Chest: no respiratory distress Abdomen: soft, nontender Uterine Fundus: firm, appropriately tender DVT Evaluation: No calf swelling or tenderness Extremities: no edema Skin: warm, dry  Recent Labs    12/13/20 1719  HGB 11.4*  HCT 34.5*    Assessment/Plan: Sydney Higgins is a 25 y.o. G2P1011 s/p SVD at [redacted]w[redacted]d   PPD#1 - Doing well Contraception: POPs at discharge Feeding: Tolerating PO and fluids  ID: GBS negative  Dispo: Open to discharge at 24 hours, dependent on baby's workup   Raymon Mutton, MS3  I saw and evaluated the patient. I agree with the findings and the plan of care as documented in the resident's note.  Renard Matter, MD, MPH OB Fellow, Faculty Practice

## 2020-12-16 MED ORDER — ACETAMINOPHEN 325 MG PO TABS: 650.0000 mg | ORAL_TABLET | ORAL | 0 refills | Status: DC | PRN

## 2020-12-16 MED ORDER — IBUPROFEN 200 MG PO TABS: 600.0000 mg | ORAL_TABLET | Freq: Four times a day (QID) | ORAL | Status: DC

## 2020-12-16 MED ORDER — NORETHINDRONE 0.35 MG PO TABS: 1.0000 | ORAL_TABLET | Freq: Every day | ORAL | 11 refills | Status: DC

## 2020-12-16 NOTE — Lactation Note (Signed)
This note was copied from a baby's chart. Lactation Consultation Note  Patient Name: Girl Nicci Varelas Today's Date: 12/16/2020 Reason for consult: Follow-up assessment;Infant < 6lbs;Early term 37-38.6wks;Primapara;1st time breastfeeding Age:23 hours  LC in to visit with P1 Mom of ET infant weighing 4 lbs.12 oz at birth and is now at 4% weight loss on possible day of discharge.  Mom had just fed baby 25 ml of 22 cal formula by paced bottle and was dressed, quiet and alert.    Spent some time talking with Mom about her goals with breastfeeding.  Mom states baby can latch better on right breast, her left breast nipple is slightly bruised on tip.  Breast are heavier today, no engorgement noted.  Mom admitted to not pumping due to breastfeeding often and supplementing.  Talked about importance of pumping to support her milk supply due to small size of infant.  Mom has a manual pump at home, [facility] referral was faxed on 9/18.  WIc has not called Mom.  Sarepta called [facility] BF coordinator this am and left message that this Mom and baby may be discharged home and needs a DEBP.  Baby showing some cues and offered to assist/assess baby at the breast.  Encouraged STS.  Laid baby across Mom's chest and assisted Mom to support her breast and baby's head in cross cradle hold.  Baby too sleepy to latch at this time.  LC assisted Mom with pumping on initiation setting, 24 flanges appear to be a good fit.  Mom expressed 25 ml transitional milk.  While Mom pumping, baby showing some feeding cues and LC fed baby another 5 ml, swaddled her and placed her on her back in her crib.    Coconut oil provided with instructions to use after expressing her milk onto nipple first.   Mom encouraged to call out for a latch assist/assessment at next feeding.  Mom aware of importance of awakening baby after 3 hrs to place STS to entice feeding.  If baby won't breastfeed, Mom will supplement with EBM+/formula per LPTI guidelines.  Today  baby should feed 20-30 ml per feeding.  Mom is also interested in OP lactation F/U.  On discharge, a message should be sent to clinic.  Feeding Nipple Type: Nfant Slow Flow (purple)  LATCH Score Latch: Too sleepy or reluctant, no latch achieved, no sucking elicited.  Audible Swallowing: None  Type of Nipple: Everted at rest and after stimulation (semi-flat and compressible areola)  Comfort (Breast/Nipple): Filling, red/small blisters or bruises, mild/mod discomfort (left nipple)  Hold (Positioning): Full assist, staff holds infant at breast  LATCH Score: 3   Lactation Tools Discussed/Used Tools: Pump Breast pump type: Double-Electric Breast Pump Pump Education: Setup, frequency, and cleaning;Milk Storage Reason for Pumping: <5 lbs and support milk supply Pumping frequency: Encouraged to pump after breastfeeding each time Pumped volume: 25 mL  Interventions Interventions: Breast feeding basics reviewed;Assisted with latch;Skin to skin;Breast massage;Hand express;Support pillows;Adjust position;DEBP;Education;Hand pump  Discharge WIC Program: Yes  Consult Status Consult Status: Follow-up Date: 12/16/20 Follow-up type: In-patient    Broadus John 12/16/2020, 8:49 AM

## 2020-12-16 NOTE — Social Work (Signed)
CSW provided MOB with housing resources for [employer] and [facility]. CSW made a referral to [facility].  Kathrin Greathouse, MSW, LCSW Women's and [facility] Worker  [phone] 12/16/2020  10:12 AM

## 2020-12-18 ENCOUNTER — Other Ambulatory Visit: Payer: Medicaid Other

## 2020-12-18 ENCOUNTER — Encounter: Payer: Medicaid Other | Admitting: Family Medicine

## 2020-12-22 ENCOUNTER — Other Ambulatory Visit: Payer: Medicaid Other

## 2020-12-25 ENCOUNTER — Other Ambulatory Visit: Payer: Medicaid Other

## 2020-12-25 ENCOUNTER — Encounter: Payer: Medicaid Other | Admitting: Obstetrics & Gynecology

## 2020-12-27 ENCOUNTER — Telehealth (HOSPITAL_COMMUNITY): Payer: Self-pay

## 2020-12-27 NOTE — Telephone Encounter (Signed)
"  I'm doing good. I have a tightness sore feeling in my rectal area. I think I may have hemorrhoids and I know I had some stitches." RN reviewed peri care with patient. Told patient to contact her OB-GYN if she is concerned and continues to have discomfort. Also told patient she can always come to [facility] here at [facility] and we are happy to help her. "I have pain in my lower stomach sometimes." RN reviewed uterine involution process and postpartum cramping. RN told patient to keep her bladder empty and take pain medication as prescribed by her doctor. Patient has no other questions or concerns about her healing.  "She is good. She is always hungry, feedings are going good. She sleeps in a bassinet."RN reviewed ABC's of safe sleep with patient. Patient declines any questions or concerns about baby.  EPDS score is 7.  Sharyn Lull [facility] 12/27/2020,0946

## 2020-12-29 ENCOUNTER — Other Ambulatory Visit: Payer: Medicaid Other

## 2021-01-01 ENCOUNTER — Encounter: Payer: Medicaid Other | Admitting: Obstetrics & Gynecology

## 2021-01-01 ENCOUNTER — Other Ambulatory Visit: Payer: Medicaid Other

## 2021-01-06 ENCOUNTER — Encounter: Payer: Self-pay | Admitting: Obstetrics & Gynecology

## 2021-01-06 ENCOUNTER — Other Ambulatory Visit: Payer: Self-pay

## 2021-01-06 ENCOUNTER — Ambulatory Visit (INDEPENDENT_AMBULATORY_CARE_PROVIDER_SITE_OTHER): Payer: Medicaid Other | Admitting: Obstetrics & Gynecology

## 2021-01-06 MED ORDER — DESOGESTREL-ETHINYL ESTRADIOL 0.15-30 MG-MCG PO TABS: 1.0000 | ORAL_TABLET | Freq: Every day | ORAL | 12 refills | Status: DC

## 2021-01-06 NOTE — Progress Notes (Signed)
Subjective:     Sydney Higgins is a 25 y.o. female who presents for a postpartum visit. She is 4 weeks postpartum following a spontaneous vaginal delivery. I have fully reviewed the prenatal and intrapartum course. The delivery was at 58 gestational weeks. Outcome: spontaneous vaginal delivery. Anesthesia: IV sedation. Postpartum course has been normal. Baby's course has been normal. Baby is feeding by  now bottle . Bleeding no bleeding. Bowel function is normal. Bladder function is normal. Patient is not sexually active. Contraception method is oral progesterone-only contraceptive. Postpartum depression screening: negative.  The following portions of the patient's history were reviewed and updated as appropriate: allergies, current medications, past family history, past medical history, past social history, past surgical history, and problem list.  Review of Systems Pertinent items are noted in HPI.   Objective:    BP 105/72 (BP Location: Right Arm, Patient Position: Sitting, Cuff Size: Normal)   Pulse 67   Ht 5' (1.524 m)   Wt 122 lb (55.3 kg)   LMP 03/29/2020   Breastfeeding No   BMI 23.83 kg/m   General:  alert, cooperative, and no distress   Breasts:    Lungs:   Heart:    Abdomen: soft, non-tender; bowel sounds normal; no masses,  no organomegaly   Vulva:  normal  Vagina: normal vagina  Cervix:  no lesions  Corpus: normal size, contour, position, consistency, mobility, non-tender  Adnexa:  normal adnexa  Rectal Exam:         Assessment:    normal postpartum exam. Pap smear not done at today's visit.   Plan:    1. Contraception: switch to [place] now not breast feeding 2.  3. Follow up in: 1  year  or as needed.

## 2021-02-09 IMAGING — US US ABDOMEN LIMITED
1 series · 14 of 25 positions shown · non-contrast
Comparison: Abdominal ultrasound dated 08/15/2017.

CLINICAL DATA: Right upper quadrant pain.

EXAM:
ULTRASOUND ABDOMEN LIMITED RIGHT UPPER QUADRANT

[Series 1: us abdomen limited · 14 of 46 slices shown]
[im 1/46]
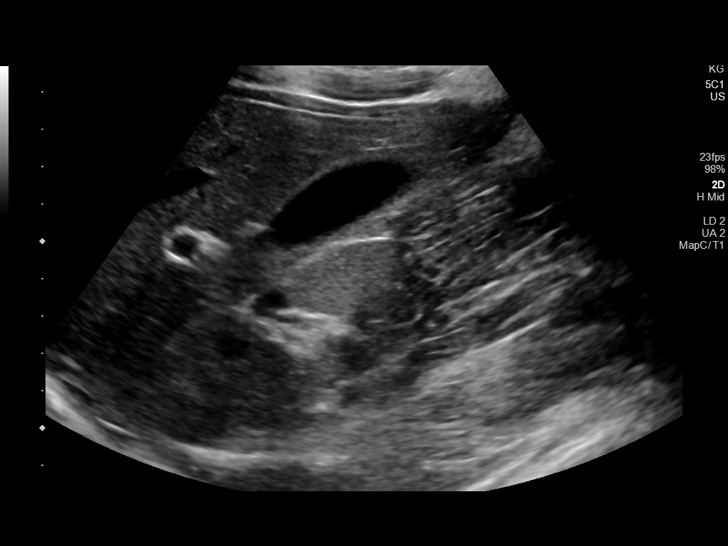
[im 4/46]
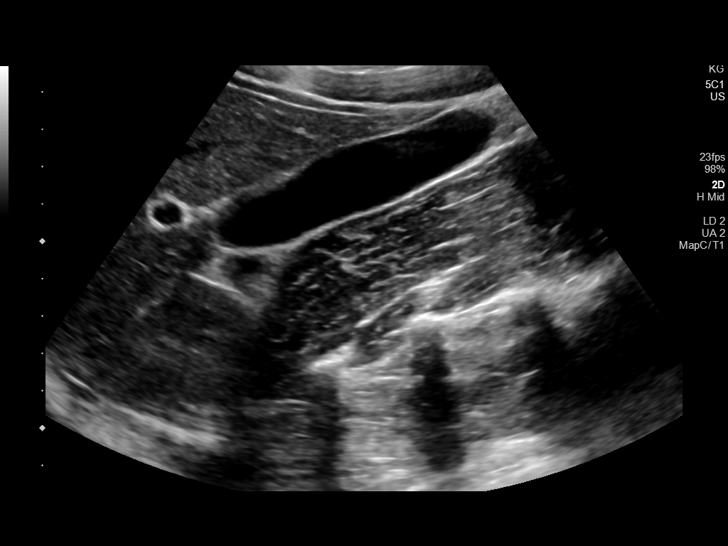
[im 8/46]
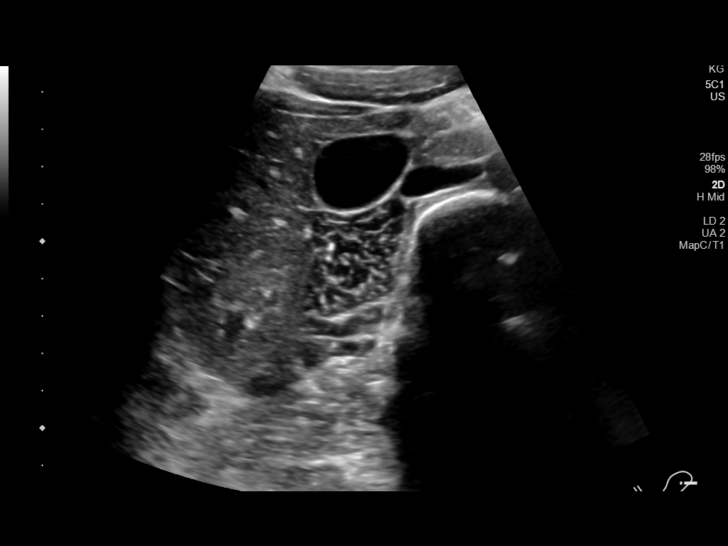
[im 12/46]
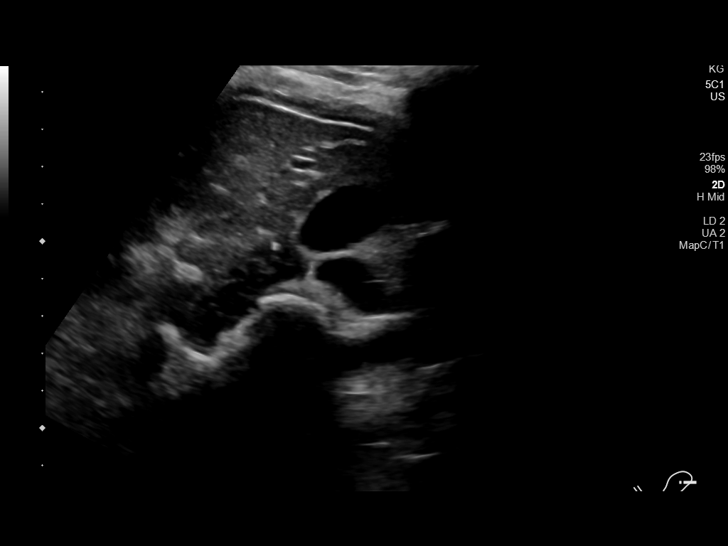
[im 16/46]
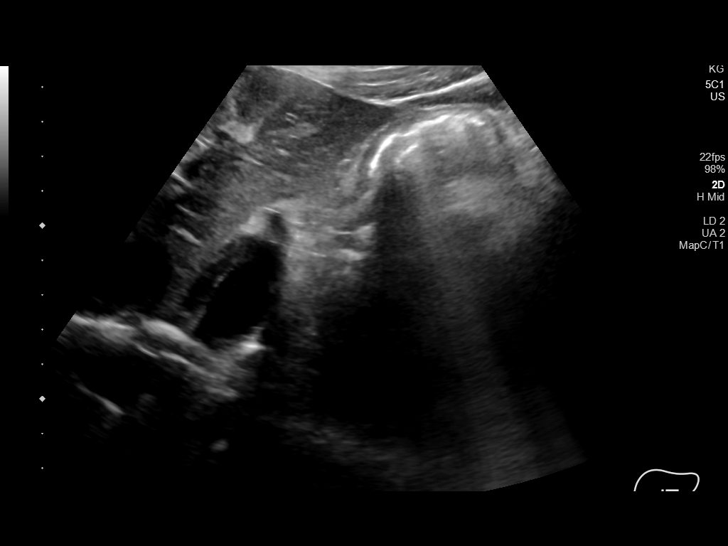
[im 17/46]
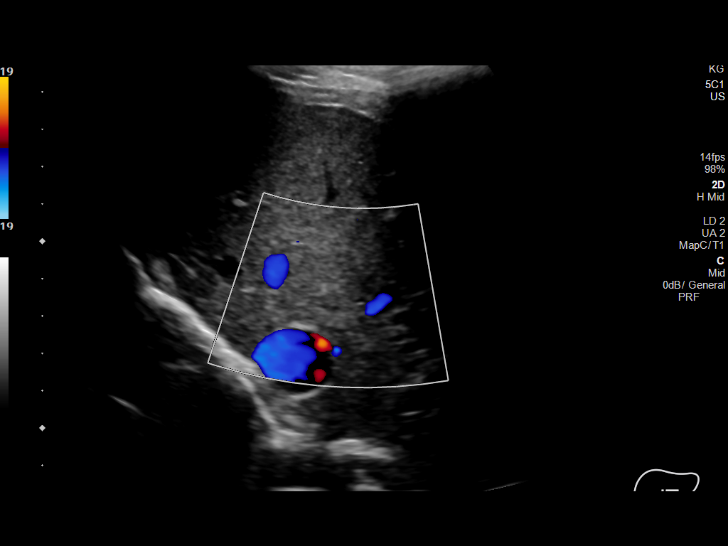
[im 21/46]
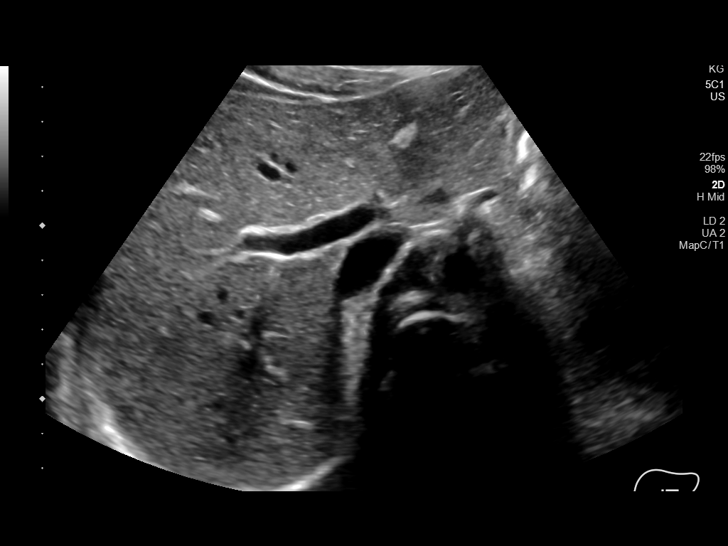
[im 25/46]
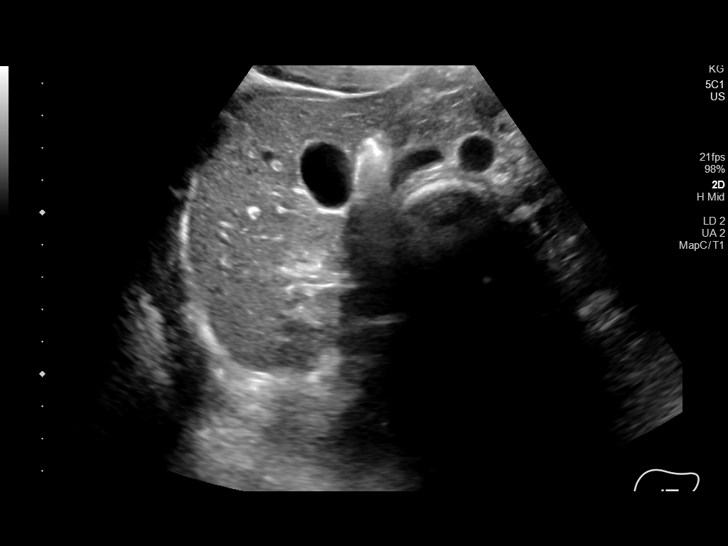
[im 29/46]
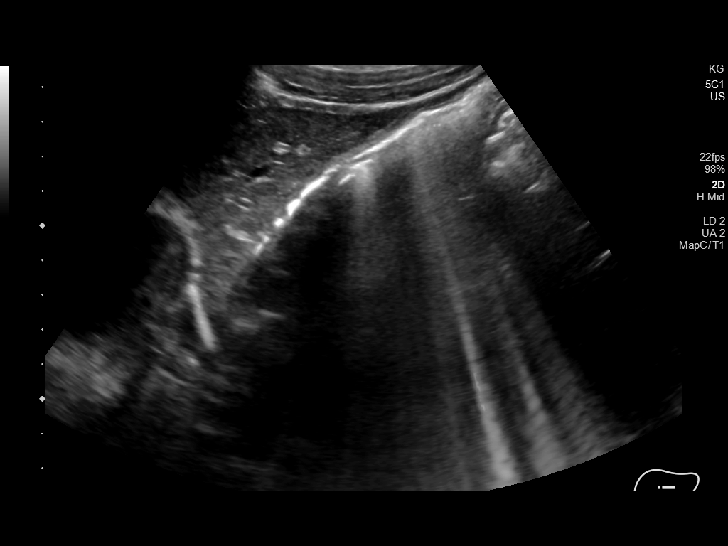
[im 31/46]
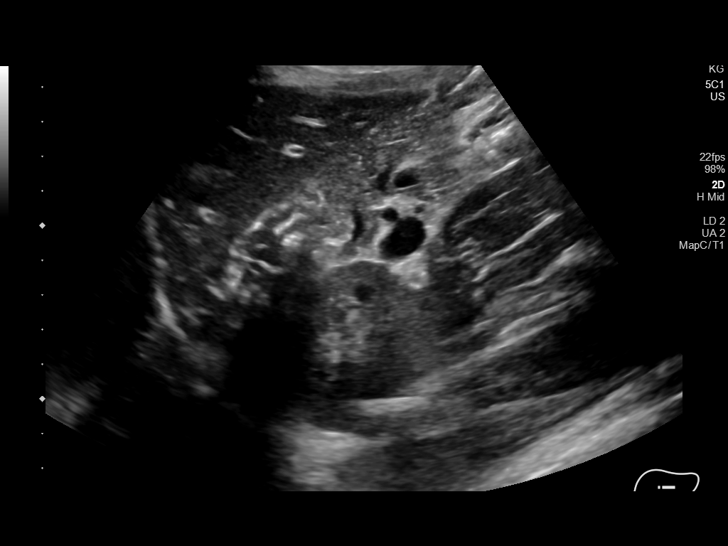
[im 34/46]
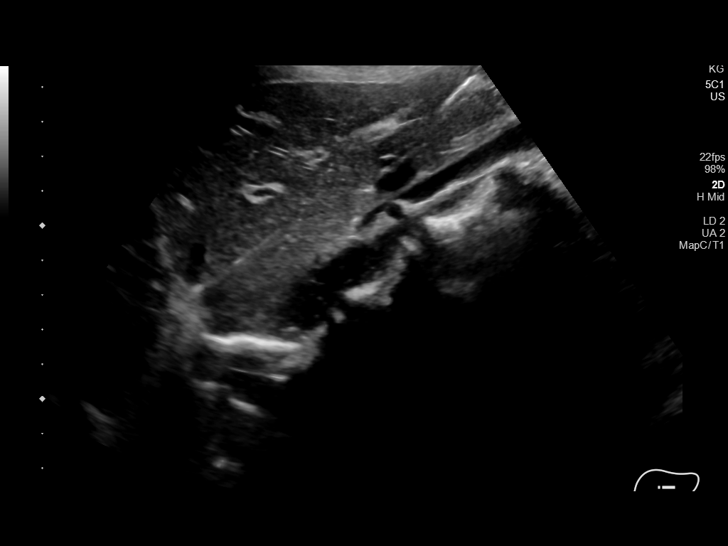
[im 38/46]
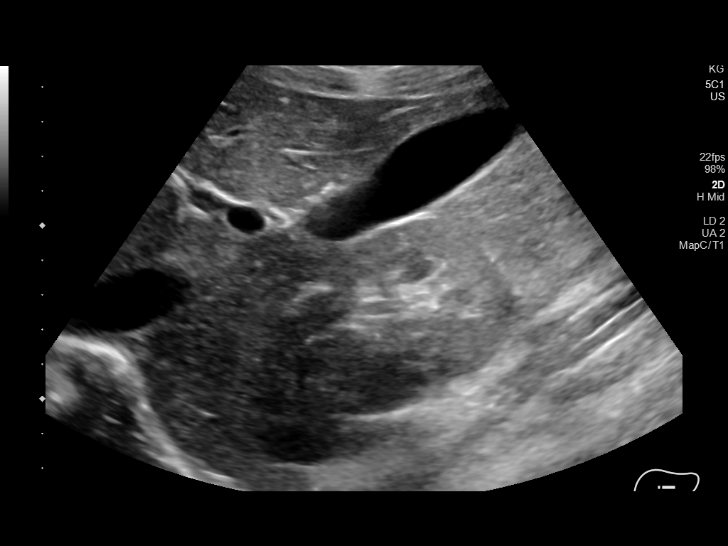
[im 42/46]
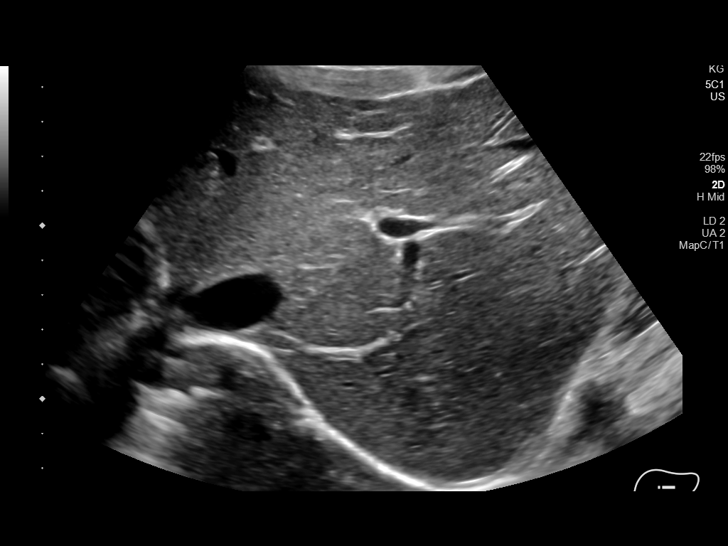
[im 46/46]
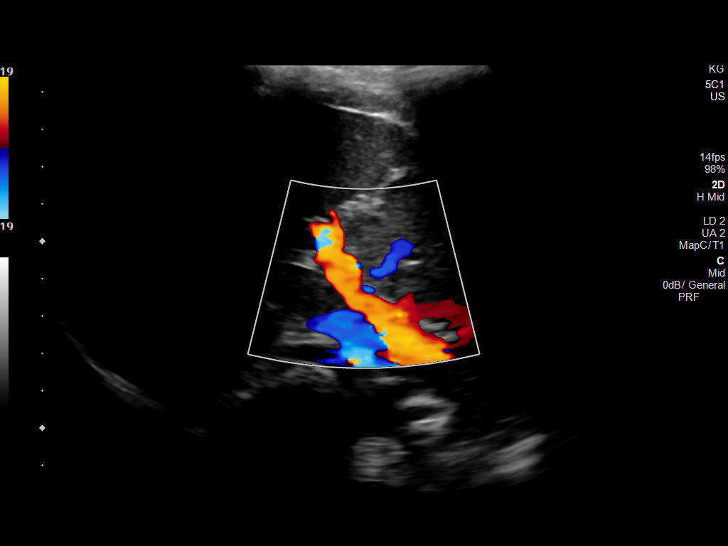

[14 of 25 positions shown; findings below may reference images not displayed]

FINDINGS: Gallbladder:

No gallstones or wall thickening visualized. No sonographic Murphy
sign noted by sonographer.

Common bile duct:

Diameter: 2 mm

Liver:

No focal lesion identified. Within normal limits for parenchymal
echogenicity. Portal vein is patent on color Doppler imaging with
normal direction of blood flow towards the liver.

Other: None.
IMPRESSION: No findings to explain the patient's symptoms.

## 2021-03-31 ENCOUNTER — Encounter (INDEPENDENT_AMBULATORY_CARE_PROVIDER_SITE_OTHER): Payer: Self-pay | Admitting: Hospital

## 2021-05-19 ENCOUNTER — Other Ambulatory Visit: Payer: Self-pay

## 2021-05-19 ENCOUNTER — Encounter: Payer: Self-pay | Admitting: Women's Health

## 2021-05-19 ENCOUNTER — Other Ambulatory Visit (HOSPITAL_COMMUNITY)
Admission: RE | Admit: 2021-05-19 | Discharge: 2021-05-19 | Disposition: A | Discharge status: Home / Self Care | Payer: Medicaid Other | Source: Ambulatory Visit | Attending: Women's Health | Admitting: Women's Health

## 2021-05-19 ENCOUNTER — Ambulatory Visit (INDEPENDENT_AMBULATORY_CARE_PROVIDER_SITE_OTHER): Payer: Medicaid Other | Admitting: Women's Health

## 2021-05-19 VITALS — BP 120/76 | HR 82 | Ht 60.0 in | Wt 129.0 lb

## 2021-05-19 DIAGNOSIS — R102 Pelvic and perineal pain: Secondary | ICD-10-CM

## 2021-05-19 DIAGNOSIS — Z124 Encounter for screening for malignant neoplasm of cervix: Secondary | ICD-10-CM | POA: Diagnosis not present

## 2021-05-19 DIAGNOSIS — Z30013 Encounter for initial prescription of injectable contraceptive: Secondary | ICD-10-CM | POA: Diagnosis not present

## 2021-05-19 MED ORDER — MEDROXYPROGESTERONE ACETATE 150 MG/ML IM SUSP: 150.0000 mg | INTRAMUSCULAR | 3 refills | Status: DC

## 2021-05-19 NOTE — Progress Notes (Signed)
GYN VISIT Patient name: Sydney Higgins MRN [id number]  Date of birth: 12-Nov-1995 Chief Complaint:   Pelvic Pain  History of Present Illness:   Sydney Higgins is a 26 y.o. G35P1011 African-American female 45mths s/p SVB being seen today for constant sharp abd/pelvic pain x 2wks. BMs once daily, no constipation.  Denies UTI sx. Denies abnormal discharge, itching/odor/irritation.   On POPs, breastfeeding, making her nauseated. Has missed a few, took HPT 3d ago and was neg. Wants to switch to depo, but doesn't want today.      Patient's last menstrual period was 04/27/2021 (exact date). The current method of family planning is oral progesterone-only contraceptive.  Last pap 02/14/19. Results were: LSIL w/ HRHPV not done  Depression screen [facility] 2/9 06/18/2020 02/14/2019 01/31/2019 01/11/2017  Decreased Interest 2 2 2 2   Down, Depressed, Hopeless 2 1 1 3   PHQ - 2 Score 4 3 3 5   Altered sleeping 0 1 2 3   Tired, decreased energy 3 1 2 2   Change in appetite 3 2 1 3   Feeling bad or failure about yourself  2 1 2 2   Trouble concentrating 1 3 2 2   Moving slowly or fidgety/restless 0 1 1 2   Suicidal thoughts 1 1 1 1   PHQ-9 Score 14 13 14 20   Difficult doing work/chores - - Very difficult -     GAD 7 : Generalized Anxiety Score 06/18/2020  Nervous, Anxious, on Edge 1  Control/stop worrying 2  Worry too much - different things 0  Trouble relaxing 1  Restless 0  Easily annoyed or irritable 2  Afraid - awful might happen 3  Total GAD 7 Score 9     Review of Systems:   Pertinent items are noted in HPI Denies fever/chills, dizziness, headaches, visual disturbances, fatigue, shortness of breath, chest pain, abdominal pain, vomiting, abnormal vaginal discharge/itching/odor/irritation, problems with periods, bowel movements, urination, or intercourse unless otherwise stated above.  Pertinent History Reviewed:  Reviewed past medical,surgical, social, obstetrical and family history.  Reviewed problem list,  medications and allergies. Physical Assessment:   Vitals:   05/19/21 1102  BP: 120/76  Pulse: 82  Weight: 129 lb (58.5 kg)  Height: 5' (1.524 m)  Body mass index is 25.19 kg/m.       Physical Examination:   General appearance: alert, well appearing, and in no distress  Mental status: alert, oriented to person, place, and time  Skin: warm & dry   Cardiovascular: normal heart rate noted  Respiratory: normal respiratory effort, no distress  Abdomen: soft, non-tender   Pelvic: VULVA: normal appearing vulva with no masses, tenderness or lesions, VAGINA: normal appearing vagina with normal color and discharge, no lesions, CERVIX: normal appearing cervix without discharge or lesions, UTERUS: uterus is normal size, shape, consistency and mildly tender, ADNEXA: normal adnexa in size, nontender and no masses  Extremities: no edema   Chaperone: Angel Neas    No results found for this or any previous visit (from the past 24 hour(s)).  Assessment & Plan:  1) Constant sharp abd/pelvic pain x 2wks> will check gc/ct from pap, and see if shows bv/trich/yeast  2) H/O abnormal pap, past due for repeat> done today  3) Contraception management> wants to switch from POPs to depo, doesn't want 1st dose today, will schedule. Rx sent  Meds:  Meds ordered this encounter  Medications   medroxyPROGESTERone (DEPO-PROVERA) 150 MG/ML injection    Sig: Inject 1 mL (150 mg total) into the muscle every 3 (three) months.  Dispense:  1 mL    Refill:  3    Order Specific Question:   Supervising Provider    Answer:   Elonda Husky, LUTHER H [[id number]]    No orders of the defined types were placed in this encounter.   Return for 1st available, Depo injection (please let her know I'm sending rx to pharmacy to bring w/ her).  [place], [facility] 05/19/2021 11:30 AM

## 2021-05-21 ENCOUNTER — Ambulatory Visit: Payer: Medicaid Other

## 2021-05-22 LAB — CYTOLOGY - PAP
Chlamydia: NEGATIVE
Comment: NEGATIVE
Comment: NORMAL
Diagnosis: NEGATIVE
Neisseria Gonorrhea: NEGATIVE

## 2021-07-01 ENCOUNTER — Ambulatory Visit: Payer: Medicaid Other | Admitting: Adult Health

## 2021-07-02 ENCOUNTER — Ambulatory Visit: Payer: Medicaid Other

## 2021-07-22 ENCOUNTER — Ambulatory Visit (INDEPENDENT_AMBULATORY_CARE_PROVIDER_SITE_OTHER): Payer: Medicaid Other | Admitting: Obstetrics and Gynecology

## 2021-07-22 ENCOUNTER — Encounter: Payer: Self-pay | Admitting: Obstetrics and Gynecology

## 2021-07-22 VITALS — BP 131/87 | HR 93 | Wt 122.8 lb

## 2021-07-22 DIAGNOSIS — Z30013 Encounter for initial prescription of injectable contraceptive: Secondary | ICD-10-CM

## 2021-07-22 DIAGNOSIS — Z3202 Encounter for pregnancy test, result negative: Secondary | ICD-10-CM | POA: Diagnosis not present

## 2021-07-22 DIAGNOSIS — F32A Depression, unspecified: Secondary | ICD-10-CM | POA: Diagnosis not present

## 2021-07-22 DIAGNOSIS — Z3009 Encounter for other general counseling and advice on contraception: Secondary | ICD-10-CM | POA: Diagnosis not present

## 2021-07-22 LAB — POCT URINE PREGNANCY: Preg Test, Ur: NEGATIVE

## 2021-07-22 MED ORDER — BUSPIRONE HCL 7.5 MG PO TABS: 7.5000 mg | ORAL_TABLET | Freq: Two times a day (BID) | ORAL | 1 refills | Status: DC

## 2021-07-22 MED ORDER — MEDROXYPROGESTERONE ACETATE 150 MG/ML IM SUSP: 150.0000 mg | INTRAMUSCULAR | 3 refills | Status: DC

## 2021-07-22 MED ORDER — BUPROPION HCL ER (XL) 150 MG PO TB24: 150.0000 mg | ORAL_TABLET | Freq: Every day | ORAL | 1 refills | Status: DC

## 2021-07-22 MED ORDER — MEDROXYPROGESTERONE ACETATE 150 MG/ML IM SUSP
150.0000 mg | Freq: Once | INTRAMUSCULAR | Status: AC
  Administered 2021-07-22: 150 mg via INTRAMUSCULAR

## 2021-07-22 NOTE — Progress Notes (Signed)
? ?GYNECOLOGY OFFICE VISIT NOTE ? ?History:  ? Sydney Higgins is a 26 y.o. G2P1011 here today for depression. She called the office crying today reaching out for help. She has a history of depression and anxiety. She is not on any medications at this time. She feels like she is detached from the baby and like the baby is more interested in other people. Been for last 3 weeks. She has done latuda and buspar in the past. Her mom suggested wellbutrin and she is open to that. She previously had a therapist and found it very helpful in the past. She feels loved by her family.  ? ?She has thoughts of self-harm but no plan.  ? ? ?  07/22/2021  ? 11:41 AM 06/18/2020  ? 10:40 AM  ?GAD 7 : Generalized Anxiety Score  ?Nervous, Anxious, on Edge 3 1  ?Control/stop worrying 3 2  ?Worry too much - different things 3 0  ?Trouble relaxing 3 1  ?Restless 3 0  ?Easily annoyed or irritable 3 2  ?Afraid - awful might happen 3 3  ?Total GAD 7 Score 21 9  ? ? ? ?  07/22/2021  ? 11:39 AM 06/18/2020  ? 10:38 AM 02/14/2019  ? 10:01 AM 01/31/2019  ?  2:19 PM 01/11/2017  ?  2:56 PM  ?Depression screen PHQ 2/9  ?Decreased Interest '1 2 2 2 2  '$ ?Down, Depressed, Hopeless '3 2 1 1 3  '$ ?PHQ - 2 Score '4 4 3 3 5  '$ ?Altered sleeping 3 0 '1 2 3  '$ ?Tired, decreased energy '3 3 1 2 2  '$ ?Change in appetite 0 '3 2 1 3  '$ ?Feeling bad or failure about yourself  '3 2 1 2 2  '$ ?Trouble concentrating '3 1 3 2 2  '$ ?Moving slowly or fidgety/restless 3 0 '1 1 2  '$ ?Suicidal thoughts '1 1 1 1 1  '$ ?PHQ-9 Score '20 14 13 14 20  '$ ?Difficult doing work/chores    Very difficult   ? ? ? ?She delivered in September of 2022 which is about 8 months ago.  ? ?Partner is at bedside and contributing to history. He reports he was not as helpful in the beginning but is now contributing.  ? ? ?  ?Past Medical History:  ?Diagnosis Date  ? Anxiety   ? 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 Depression   ? DMDD (disruptive mood dysregulation disorder) ([place]) 12/11/2015  ? Headache   ? Insomnia 12/11/2015  ?  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 02/19/2019>Repeat in 1 year per [facility],   ? UTI (urinary tract infection)   ? ? ?Past Surgical History:  ?Procedure Laterality Date  ? MASS EXCISION Left 08/26/2017  ? Procedure: EXCISION SUPERFICIAL MASS FROM LEFT SIDE OF NECK;  Surgeon: Virl Cagey, MD;  Location: [facility];  Service: General;  Laterality: Left;  ? ? ?The following portions of the patient's history were reviewed and updated as appropriate: allergies, current medications, past family history, past medical history, past social history, past surgical history and problem list.  ? ?Health Maintenance:   ?Normal pap:  ?Diagnosis  ?Date Value Ref Range Status  ?05/19/2021   Final  ? - Negative for intraepithelial lesion or malignancy (NILM)  ? ? ?Review of Systems:  ?Pertinent items noted in HPI and remainder of comprehensive ROS otherwise negative. ? ?Physical Exam:  ?BP 131/87 (BP Location: Right Arm, Patient Position: Sitting, Cuff Size: Normal)   Pulse 93   Wt  122 lb 12.8 oz (55.7 kg)   LMP 06/27/2021   Breastfeeding No   BMI 23.98 kg/m?  ?CONSTITUTIONAL: Well-developed, well-nourished female in no acute distress.  ?HEENT:  Normocephalic, atraumatic. External right and left ear normal. No scleral icterus.  ?NECK: Normal range of motion, supple, no masses noted on observation ?SKIN: No rash noted. Not diaphoretic. No erythema. No pallor. ?MUSCULOSKELETAL: Normal range of motion. No edema noted. ?NEUROLOGIC: Alert and oriented to person, place, and time. Normal muscle tone coordination. No cranial nerve deficit noted. ?PSYCHIATRIC: Normal mood and affect. Normal behavior. Normal judgment and thought content. She is tearful and crying today.  ? ?CARDIOVASCULAR: Normal heart rate noted ?RESPIRATORY: Effort and breath sounds normal, no problems with respiration noted ?ABDOMEN: No masses noted. No other overt distention noted.   ? ?PELVIC: Deferred ? ?Labs and Imaging ?No results found for this or any  previous visit (from the past 168 hour(s)). ?No results found.  ?Assessment and Plan:  ? 1. Depression, unspecified depression type ?- Recommend referral to [facility] and PCP for ongoing management  ?- Will start on wellbutrin and buspar but will have office check in on her in 2 weeks by phone.  ?-     Ambulatory referral to [facility] ?-     Ambulatory referral to [facility] ?-     buPROPion (WELLBUTRIN XL) 150 MG 24 hr tablet; Take 1 tablet (150 mg total) by mouth daily. ?-     busPIRone (BUSPAR) 7.5 MG tablet; Take 1 tablet (7.5 mg total) by mouth 2 (two) times daily. ? ?Birth control counseling ?-     medroxyPROGESTERone (DEPO-PROVERA) 150 MG/ML injection; Inject 1 mL (150 mg total) into the muscle every 3 (three) months. ? ?- Depo today - check UPT and then she will check home UPT in 2 weeks ? ? ?Routine preventative health maintenance measures emphasized. ?Please refer to After Visit Summary for other counseling recommendations.  ? ?Return if symptoms worsen or fail to improve. ? ?Radene Gunning, MD, FACOG ?Obstetrician Social research officer, government, Faculty Practice ?[facility] [place] ? ? ? ? ? ?

## 2021-08-11 ENCOUNTER — Encounter: Payer: Self-pay | Admitting: *Deleted

## 2021-09-01 NOTE — BH Specialist Note (Signed)
Integrated Behavioral Health via Telemedicine Visit  09/09/2021 Sydney Higgins [id number]  Number of Integrated Behavioral Health Clinician visits: 1- Initial Visit  Session Start time: 0017   Session End time: 0926  Total time in minutes: 41   Referring Provider: Radene Gunning, MD Patient/Family location: [facility] Provider location: [facility] [place] [facility]  All persons participating in visit: Patient Sydney Higgins and [place]   Types of Service: Individual psychotherapy and Video visit  I connected with Sydney Higgins and/or [name]'s  n/a  via  Telephone or Video Enabled Telemedicine Application  (Video is Caregility application) and verified that I am speaking with the correct person using two identifiers. Discussed confidentiality: Yes   I discussed the limitations of telemedicine and the availability of in person appointments.  Discussed there is a possibility of technology failure and discussed alternative modes of communication if that failure occurs.  I discussed that engaging in this telemedicine visit, they consent to the provision of behavioral healthcare and the services will be billed under their insurance.  Patient and/or legal guardian expressed understanding and consented to Telemedicine visit: Yes   Presenting Concerns: Patient and/or family reports the following symptoms/concerns: Depression, anxiety affecting ability to work; taking Wellbutrin and Buspar; pt says was previously on Adderall for ADHD by previous PCP; establishing care with a new PCP on Friday. Pt goals are for emotional wellness and to obtain her own housing.  Duration of problem: Ongoing with increase postpartum; Severity of problem: severe  Patient and/or Family's Strengths/Protective Factors: Concrete supports in place (healthy food, safe environments, etc.) and Sense of purpose  Goals Addressed: Patient will:  Reduce symptoms of: anxiety,  depression, and stress   Increase knowledge and/or ability of: healthy habits and self-management skills   Demonstrate ability to: Increase healthy adjustment to current life circumstances and Increase motivation to adhere to plan of care  Progress towards Goals: Ongoing  Interventions: Interventions utilized:  Solution-Focused Strategies and Psychoeducation and/or Health Education Standardized Assessments completed: GAD-7 and PHQ 9  Patient and/or Family Response: Patient agrees with treatment plan.   Assessment: Patient currently experiencing Major depressive disorder, recurrent, moderate; Anxiety disorder, unspecified (both as previously diagnosed); Psychosocial stress  Patient may benefit from psychoeducation and brief therapeutic interventions regarding coping with symptoms of depression, anxiety, life stress .  Plan: Follow up with behavioral health clinician on : Two weeks Behavioral recommendations:  -Agree to referral to [facility] for ongoing therapy and BH medication management; ;use walk-in services as needed (information on After Visit Summary) -Discuss with PCP on 09/11/21 taking over [facility] medications; any change to medications prior to being seen by psychiatry -Begin Worry Time strategy, as discussed. Start by setting up start and end time reminders on phone today; continue daily for two weeks. -Continue using daily self-coping strategies that have been helpful (prayer, meditation, coloring, journaling) Referral(s): Integrated Orthoptist (In Clinic) and [place] (LME/Outside Clinic)  I discussed the assessment and treatment plan with the patient and/or parent/guardian. They were provided an opportunity to ask questions and all were answered. They agreed with the plan and demonstrated an understanding of the instructions.   They were advised to call back or seek an in-person evaluation if the symptoms worsen or if the condition fails to  improve as anticipated.  Garlan Fair, LCSW     09/09/2021    8:49 AM 07/22/2021   11:39 AM 06/18/2020   10:38 AM 02/14/2019  10:01 AM 01/31/2019    2:19 PM  Depression screen PHQ 2/9  Decreased Interest '3 1 2 2 2  '$ Down, Depressed, Hopeless '2 3 2 1 1  '$ PHQ - 2 Score '5 4 4 3 3  '$ Altered sleeping 0 3 0 1 2  Tired, decreased energy '3 3 3 1 2  '$ Change in appetite 0 0 '3 2 1  '$ Feeling bad or failure about yourself  '2 3 2 1 2  '$ Trouble concentrating '3 3 1 3 2  '$ Moving slowly or fidgety/restless 1 3 0 1 1  Suicidal thoughts 0 '1 1 1 1  '$ PHQ-9 Score '14 20 14 13 14  '$ Difficult doing work/chores     Very difficult      09/09/2021    8:53 AM 07/22/2021   11:41 AM 06/18/2020   10:40 AM  GAD 7 : Generalized Anxiety Score  Nervous, Anxious, on Edge '3 3 1  '$ Control/stop worrying '3 3 2  '$ Worry too much - different things 3 3 0  Trouble relaxing '3 3 1  '$ Restless 2 3 0  Easily annoyed or irritable '3 3 2  '$ Afraid - awful might happen '3 3 3  '$ Total GAD 7 Score 20 21 9

## 2021-09-09 ENCOUNTER — Ambulatory Visit (INDEPENDENT_AMBULATORY_CARE_PROVIDER_SITE_OTHER): Payer: Medicaid Other | Admitting: Clinical

## 2021-09-09 DIAGNOSIS — Z658 Other specified problems related to psychosocial circumstances: Secondary | ICD-10-CM

## 2021-09-09 DIAGNOSIS — F419 Anxiety disorder, unspecified: Secondary | ICD-10-CM

## 2021-09-09 DIAGNOSIS — F331 Major depressive disorder, recurrent, moderate: Secondary | ICD-10-CM

## 2021-09-09 NOTE — Patient Instructions (Signed)
Center for [facility] at [facility] [place], [place] [address] [phone] (main office) [phone] office)   [place] Child Psychologist, counselling  (Childcare options, Early childcare development, etc.) PopTick.no    [facility]  [address] [place], [place] [address] [phone] or [phone] [facility] URGENT CARE 24/7 FOR ANYONE [address], [place], [place] Fax: [phone] [email or web address] *Interpreters available *Accepts all insurance and uninsured for Urgent Care needs *Accepts Medicaid and uninsured for outpatient treatment     ONLY FOR [facility]  New patient assessment and therapy walk-ins Mondays and Wednesdays 8am-11am First and second Fridays 1pm-5pm  New patient psychiatry and medication management walk-ins:  Mondays, Wednesdays, Thursdays, Fridays 8am -11am NO PSYCHIATRY WALK-INS on [place] (serves [place], [place], [place], [place], [place], [place], [place], [place], [place], [place], [place], [place], and [place] counties) [address], [place], [place] [address] [phone] http://[email or web address]-[email or web address]  **Rental assistance, Home Rehabilitation,Weatherization Assistance Program, Forensic psychologist, Housing Voucher Program  [employer] for Housing and [employer] Studies: Chief Financial Officer Resources to residents of [place], [place], and [place] Make sure you have your documents ready, including:  (Household income verification: 2 months pay stubs, unemployment/social security award letter, statement of no income for all household members over 30) Photo ID for all household members over 18 Utility Bill/Rent Ledger/Lease: must show past due amount for utilities/rent, or the rental agreement if rent is  current 2. Start your application online or by paper (in [place] or [place]) at:     http://boyd-evans.org/  3. Once you have completed the online application, you will get an email confirmation message from the county. Expect to hear back by phone or email at least 6-10 weeks from submitting your application.  4. While you wait:  Call [phone] to check in on your application Let your landlord know that you've applied. Your landlord will be asked to submit documents (W-9) during this application process. Payments will be made directly to the landlord/property [place] or utility assistance for [employer], [place], and [place] at https://rb.gy/dvxbfv Questions? Call or email Renee at [phone] or [email or web address]   Eviction Mediation Program: The HOPE Program Https://www.rebuild.http://mills-williams.net/ HOPE Progam serves low-income renters in [place] counties, defined as less than or equal to 80% of the area median income for the county where the renter lives. In the following 12 counties, you should apply to your local rent and utility assistance program INSTEAD OF the HOPE Program: [place], [place], [place], [place], [place], [place], [place], [place], [place], [place], [place]  If you live outside of [place], contact [place] call center at [phone] to talk to a Program Representative Monday-Friday, 8am-5pm Note that Native American tribes also received federal funding for rent and utility assistance programs. Recognized members of the following tribes will be served by programs managed by tribal governments, including: [place] of [place] Indians, [place], [place], [place] of [place] and [place], [place], [phone] [email or web address] .[employer],  [place], [phone] [email or web address] .[place] [address], [place], [place] [address] [phone] www.gha-[place].[facility] [place], [place],  [address] [phone] https://manning.com/ **Programs include: [phone]  Prevention and Housing Counseling, Healthy Doctor, general practice, Homeless Prevention and [place] [address], [place], [place], [place] [address] [phone] www.https://www.farmer-stevens.info/ **housing applications/recertification; tax payment relief/exemption under specific qualifications  [facility] [address], [place], [place] [address] www.onlinegreensboro.com/~maryshouse **transitional housing for women in recovery who have minor children or are pregnant  [place], [place], [place] [address] ArtistMovie.se  **emergency shelter and support services for families facing homelessness  [place] [address], [place], [place] [address] [phone] www.youthfocus.org **transitional housing to pregnant women; emergency housing for youth who have run away, are experiencing a family crisis, are victims of abuse or neglect, or are homeless  [facility] [address] [place], [place] [address] [phone] ircgso.org **Drop-in center for people experiencing homelessness; overnight warming center when temperature is 25 degrees or below  Re-Entry Staffing [place], [place], [place] [address] [phone] https://reentrystaffingagency.org/ **help with affordable housing to people experiencing homelessness or unemployment due to incarceration  [facility] [address] [place], [place] [address] [phone] www.greensborourbanministry.org  **emergency and transitional housing, rent/mortgage assistance, utility  assistance  [facility] [address] [place], [place] [address] [phone] www.salvationarmyofgreensboro.org **emergency and transitional housing  Habitat for [employer] [place], [place], [place] [address] [phone] Www.habitatgreensboro.[place], [place], [place] [address] [phone] https://chshousing.org **[place] [facility]  Housing Consultants Group [address] [place] [address], [place], [place] [address] [phone] arrivance.com **home buyer education courses, foreclosure prevention  [facility] [address], [place], [place] [address] [phone] WirelessNovelties.no **Environmental Exposure Assessment (investigation of homes where either children or pregnant women with a confirmed elevated blood lead level reside)  [facility] [place], [place], [place] [address] [phone] http://www.perez.com/ **Home Expense Assistance/Repairs Program; offers home accessibility updates, such as ramps or bars in the bathroom  Self-Help Credit [facility] [address], [place], [place] [address] [phone] https://www.self-help.org/locations/[place]-branch **Offers credit-building and banking services to people unable to use [place] of [facility] [place], [place], [place] [address] [phone] RoulettePays.com.br   [facility] [address] [place], [place] [address] [phone]  ClubMonetize.fr **housing applications/recertification, emergency and transitional housing  Open [employer] of [employer] [address] [place], [place] [address] [phone] www.odm-hp.org  **emergency and permanent housing; rent/mortgage payment assistance  Habitat for [employer], [place] and [place] [address] [place], [place] [address] [phone] ArchitectReviews.com.au  Family Services of the [place], [employer] [place], [place], [place] [address] www.familyservice-piedmont.org **emergency shelter for victims of domestic violence and sexual assault  [facility] [address], [place], [place] [address] [phone] www.senior-resources-guil[place].org **Home expense assistance/repairs for older [place] of [place], [place], [place] [address] [phone] NameDisc.is  **May offer help with minor housing repairs  [facility] [address], [place], [place] [address] [phone] **emergency housing for victims of domestic violence  Housing Resources [place], [place], [place] [facility])  [facility] [address], [place],  [address] [phone] www.newrha.[facility] [address] [place],  [address] ([place], [place], [place]  [address] [phone] www.co.[place].us **Housing applications/recertification; tax payment relief/exemption w specific qualifications  [place] for Homeless [address], [place], [place] [address] [phone] Http://www.rchelpforhomeless.org/HOME_PAGE.html  HELP, [place] [address] [place], [place] [address] [phone] [place] [phone] Hours of Operation: Monday-Friday, 8:30am-5:00pm http://helpincorporated.org **Includes emergency housing for victims of domestic violence

## 2021-09-10 NOTE — BH Specialist Note (Signed)
Pt did not arrive to video visit and did not answer the phone; Left HIPPA-compliant message to call back Burgandy Hackworth from [facility] at  [phone] (Mahaila Tischer's office).  ?; left MyChart message for patient.  ? ?

## 2021-09-23 ENCOUNTER — Ambulatory Visit: Payer: Medicaid Other | Admitting: Clinical

## 2021-09-23 DIAGNOSIS — Z91199 Patient's noncompliance with other medical treatment and regimen due to unspecified reason: Secondary | ICD-10-CM

## 2021-11-10 ENCOUNTER — Ambulatory Visit (INDEPENDENT_AMBULATORY_CARE_PROVIDER_SITE_OTHER): Payer: Medicaid Other | Admitting: *Deleted

## 2021-11-10 DIAGNOSIS — Z3042 Encounter for surveillance of injectable contraceptive: Secondary | ICD-10-CM

## 2021-11-10 DIAGNOSIS — Z3202 Encounter for pregnancy test, result negative: Secondary | ICD-10-CM | POA: Diagnosis not present

## 2021-11-10 LAB — POCT URINE PREGNANCY: Preg Test, Ur: NEGATIVE

## 2021-11-10 MED ORDER — MEDROXYPROGESTERONE ACETATE 150 MG/ML IM SUSP
150.0000 mg | Freq: Once | INTRAMUSCULAR | Status: AC
  Administered 2021-11-10: 150 mg via INTRAMUSCULAR

## 2021-11-10 NOTE — Progress Notes (Signed)
   NURSE VISIT- INJECTION  SUBJECTIVE:  Sydney Higgins is a 26 y.o. G21P1011 female here for a Depo Provera for contraception/period management. She is a GYN patient.   OBJECTIVE:  There were no vitals taken for this visit.  Appears well, in no apparent distress  Injection administered in: Right deltoid  Meds ordered this encounter  Medications   medroxyPROGESTERone (DEPO-PROVERA) injection 150 mg    ASSESSMENT: GYN patient Depo Provera for contraception/period management PLAN: Follow-up: in 11-13 weeks for next Depo   Levy Pupa  11/10/2021 4:10 PM

## 2021-12-21 ENCOUNTER — Telehealth (HOSPITAL_COMMUNITY): Payer: Medicaid Other | Admitting: Psychiatry

## 2021-12-21 ENCOUNTER — Encounter (HOSPITAL_COMMUNITY): Payer: Self-pay

## 2022-01-21 ENCOUNTER — Ambulatory Visit: Payer: Medicaid Other | Admitting: Adult Health

## 2022-01-27 ENCOUNTER — Ambulatory Visit (INDEPENDENT_AMBULATORY_CARE_PROVIDER_SITE_OTHER): Payer: Medicaid Other

## 2022-01-27 DIAGNOSIS — Z3042 Encounter for surveillance of injectable contraceptive: Secondary | ICD-10-CM | POA: Diagnosis not present

## 2022-01-27 MED ORDER — MEDROXYPROGESTERONE ACETATE 150 MG/ML IM SUSP
150.0000 mg | Freq: Once | INTRAMUSCULAR | Status: AC
  Administered 2022-01-27: 150 mg via INTRAMUSCULAR

## 2022-01-27 NOTE — Progress Notes (Signed)
   NURSE VISIT- INJECTION  SUBJECTIVE:  Sydney Higgins is a 26 y.o. G77P1011 female here for a Depo Provera for contraception/period management. She is a GYN patient.   OBJECTIVE:  There were no vitals taken for this visit.  Appears well, in no apparent distress  Injection administered in: Left deltoid  Meds ordered this encounter  Medications   medroxyPROGESTERone (DEPO-PROVERA) injection 150 mg    ASSESSMENT: GYN patient Depo Provera for contraception/period management PLAN: Follow-up: in 11-13 weeks for next Depo   Andrez Grime  01/27/2022 10:32 AM

## 2022-03-18 ENCOUNTER — Ambulatory Visit: Payer: Medicaid Other | Admitting: Obstetrics & Gynecology

## 2022-04-01 ENCOUNTER — Ambulatory Visit: Payer: Medicaid Other | Admitting: Obstetrics & Gynecology

## 2022-04-08 ENCOUNTER — Other Ambulatory Visit (HOSPITAL_COMMUNITY)
Admission: RE | Admit: 2022-04-08 | Discharge: 2022-04-08 | Disposition: A | Discharge status: Home / Self Care | Payer: Medicaid Other | Source: Ambulatory Visit | Attending: Obstetrics & Gynecology | Admitting: Obstetrics & Gynecology

## 2022-04-08 ENCOUNTER — Ambulatory Visit: Payer: Medicaid Other | Admitting: Adult Health

## 2022-04-08 ENCOUNTER — Encounter: Payer: Self-pay | Admitting: Adult Health

## 2022-04-08 VITALS — BP 148/92 | HR 53 | Ht 60.0 in | Wt 113.0 lb

## 2022-04-08 DIAGNOSIS — Z113 Encounter for screening for infections with a predominantly sexual mode of transmission: Secondary | ICD-10-CM | POA: Insufficient documentation

## 2022-04-08 DIAGNOSIS — F32A Depression, unspecified: Secondary | ICD-10-CM | POA: Insufficient documentation

## 2022-04-08 DIAGNOSIS — N898 Other specified noninflammatory disorders of vagina: Secondary | ICD-10-CM | POA: Diagnosis not present

## 2022-04-08 DIAGNOSIS — F419 Anxiety disorder, unspecified: Secondary | ICD-10-CM

## 2022-04-08 DIAGNOSIS — Z3009 Encounter for other general counseling and advice on contraception: Secondary | ICD-10-CM | POA: Insufficient documentation

## 2022-04-08 DIAGNOSIS — R102 Pelvic and perineal pain: Secondary | ICD-10-CM | POA: Diagnosis not present

## 2022-04-08 DIAGNOSIS — Z3042 Encounter for surveillance of injectable contraceptive: Secondary | ICD-10-CM | POA: Insufficient documentation

## 2022-04-08 MED ORDER — BUPROPION HCL ER (XL) 150 MG PO TB24: 150.0000 mg | ORAL_TABLET | Freq: Every day | ORAL | 6 refills | Status: DC

## 2022-04-08 MED ORDER — MEDROXYPROGESTERONE ACETATE 150 MG/ML IM SUSP: 150.0000 mg | INTRAMUSCULAR | 3 refills | Status: DC

## 2022-04-08 MED ORDER — BUSPIRONE HCL 7.5 MG PO TABS: 7.5000 mg | ORAL_TABLET | Freq: Two times a day (BID) | ORAL | 6 refills | Status: DC
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
## 2022-04-09 LAB — CERVICOVAGINAL ANCILLARY ONLY
Bacterial Vaginitis (gardnerella): POSITIVE — AB
Candida Glabrata: NEGATIVE
Candida Vaginitis: NEGATIVE
Chlamydia: NEGATIVE
Comment: NEGATIVE
Comment: NEGATIVE
Comment: NEGATIVE
Comment: NEGATIVE
Comment: NEGATIVE
Comment: NORMAL
Neisseria Gonorrhea: NEGATIVE
Trichomonas: NEGATIVE

## 2022-04-13 ENCOUNTER — Other Ambulatory Visit: Payer: Self-pay | Admitting: Adult Health

## 2022-04-13 MED ORDER — METRONIDAZOLE 500 MG PO TABS: 500.0000 mg | ORAL_TABLET | Freq: Two times a day (BID) | ORAL | 0 refills | Status: DC

## 2022-04-13 NOTE — Progress Notes (Signed)
+  BV on vaginal swab will rx flagyl,no sex or alcohol while taking  ?

## 2022-04-14 ENCOUNTER — Ambulatory Visit (HOSPITAL_COMMUNITY): Payer: Medicaid Other | Attending: Adult Health

## 2022-04-15 ENCOUNTER — Ambulatory Visit: Payer: Medicaid Other

## 2022-04-23 ENCOUNTER — Telehealth: Payer: Self-pay | Admitting: Clinical

## 2022-04-23 NOTE — Telephone Encounter (Signed)
Attempt call regarding referral; Left HIPPA-compliant message to call back Roselyn Reef from [employer] for [employer] at [facility] at  [phone] [facility] office).

## 2022-05-03 NOTE — BH Specialist Note (Deleted)
Integrated Behavioral Health via Telemedicine Visit  05/03/2022 Dale Strausser [id number]  Number of Integrated Behavioral Health Clinician visits: 1- Initial Visit  Session Start time: 1031   Session End time: 0926  Total time in minutes: 41   Referring Provider: *** Patient/Family location: [facility] Provider location: *** All persons participating in visit: *** Types of Service: {CHL AMB TYPE OF [phone]}  I connected with Lashai Loudermilk and/or Petrona Branscome's {family members:20773} via  Telephone or Video Enabled Telemedicine Application  (Video is Caregility application) and verified that I am speaking with the correct person using two identifiers. Discussed confidentiality: {YES/NO:21197}  I discussed the limitations of telemedicine and the availability of in person appointments.  Discussed there is a possibility of technology failure and discussed alternative modes of communication if that failure occurs.  I discussed that engaging in this telemedicine visit, they consent to the provision of behavioral healthcare and the services will be billed under their insurance.  Patient and/or legal guardian expressed understanding and consented to Telemedicine visit: {YES/NO:21197}  Presenting Concerns: Patient and/or family reports the following symptoms/concerns: *** Duration of problem: ***; Severity of problem: {Mild/Moderate/Severe:20260}  Patient and/or Family's Strengths/Protective Factors: {CHL AMB BH PROTECTIVE FACTORS:[phone]}  Goals Addressed: Patient will:  Reduce symptoms of: {IBH Symptoms:21014056}   Increase knowledge and/or ability of: {IBH Patient Tools:21014057}   Demonstrate ability to: {IBH Goals:21014053}  Progress towards Goals: {CHL AMB BH PROGRESS TOWARDS GOALS:[phone]}  Interventions: Interventions utilized:  {IBH Interventions:21014054} Standardized Assessments completed: {IBH Screening Tools:21014051}  Patient and/or Family Response:  ***  Assessment: Patient currently experiencing ***.   Patient may benefit from ***.  Plan: Follow up with behavioral health clinician on : *** Behavioral recommendations: *** Referral(s): {IBH Referrals:21014055}  I discussed the assessment and treatment plan with the patient and/or parent/guardian. They were provided an opportunity to ask questions and all were answered. They agreed with the plan and demonstrated an understanding of the instructions.   They were advised to call back or seek an in-person evaluation if the symptoms worsen or if the condition fails to improve as anticipated.  Caroleen Hamman Thurl Boen, LCSW

## 2022-05-04 ENCOUNTER — Ambulatory Visit (INDEPENDENT_AMBULATORY_CARE_PROVIDER_SITE_OTHER): Payer: Medicaid Other | Admitting: *Deleted

## 2022-05-04 DIAGNOSIS — Z3042 Encounter for surveillance of injectable contraceptive: Secondary | ICD-10-CM | POA: Diagnosis not present

## 2022-05-04 MED ORDER — MEDROXYPROGESTERONE ACETATE 150 MG/ML IM SUSP
150.0000 mg | Freq: Once | INTRAMUSCULAR | Status: AC
  Administered 2022-05-04: 150 mg via INTRAMUSCULAR

## 2022-05-04 NOTE — Progress Notes (Signed)
   NURSE VISIT- INJECTION  SUBJECTIVE:  Sydney Higgins is a 27 y.o. G15P1011 female here for a Depo Provera for contraception/period management. She is a GYN patient.   OBJECTIVE:  There were no vitals taken for this visit.  Appears well, in no apparent distress  Injection administered in: Right deltoid  Meds ordered this encounter  Medications   medroxyPROGESTERone (DEPO-PROVERA) injection 150 mg    ASSESSMENT: GYN patient Depo Provera for contraception/period management PLAN: Follow-up: in 11-13 weeks for next Depo   Alice Rieger  05/04/2022 11:42 AM

## 2022-05-10 NOTE — BH Specialist Note (Signed)
Pt did not arrive to video visit and did not answer the phone; Left HIPPA-compliant message to call back Sinahi Knights from [facility] at  [phone] (Magon Croson's office).  ?; left MyChart message for patient.  ? ?

## 2022-05-21 ENCOUNTER — Ambulatory Visit: Payer: Medicaid Other | Admitting: Clinical

## 2022-05-21 DIAGNOSIS — Z91199 Patient's noncompliance with other medical treatment and regimen due to unspecified reason: Secondary | ICD-10-CM

## 2022-07-27 ENCOUNTER — Ambulatory Visit: Payer: Medicaid Other

## 2022-08-04 ENCOUNTER — Telehealth: Payer: Self-pay

## 2022-08-04 NOTE — Telephone Encounter (Signed)
Called patient to schedule depo appointment she missed. Mom answered phone and said she would get her to call office when she got home this afternoon.

## 2022-08-11 ENCOUNTER — Ambulatory Visit (INDEPENDENT_AMBULATORY_CARE_PROVIDER_SITE_OTHER): Payer: Medicaid Other | Admitting: *Deleted

## 2022-08-11 DIAGNOSIS — Z3202 Encounter for pregnancy test, result negative: Secondary | ICD-10-CM

## 2022-08-11 DIAGNOSIS — Z3042 Encounter for surveillance of injectable contraceptive: Secondary | ICD-10-CM | POA: Diagnosis not present

## 2022-08-11 LAB — POCT URINE PREGNANCY: Preg Test, Ur: NEGATIVE

## 2022-08-11 MED ORDER — MEDROXYPROGESTERONE ACETATE 150 MG/ML IM SUSY
150.0000 mg | PREFILLED_SYRINGE | Freq: Once | INTRAMUSCULAR | Status: AC
Start: 2022-08-11 — End: 2022-08-11
  Administered 2022-08-11: 150 mg via INTRAMUSCULAR

## 2022-08-11 NOTE — Progress Notes (Signed)
   NURSE VISIT- INJECTION  SUBJECTIVE:  Sydney Higgins is a 27 y.o. G3P1011 female here for a Depo Provera for contraception/period management. She is a GYN patient. Pt was late getting Depo. It was due between April 24-May 8. No sex since last year. UPT negative in office.   OBJECTIVE:  There were no vitals taken for this visit.  Appears well, in no apparent distress  Injection administered in: Left deltoid  Meds ordered this encounter  Medications   medroxyPROGESTERone Acetate SUSY 150 mg    ASSESSMENT: GYN patient Depo Provera for contraception/period management PLAN: Follow-up: in 11-13 weeks for next Depo   Malachy Mood  08/11/2022 2:13 PM

## 2022-09-02 ENCOUNTER — Ambulatory Visit: Payer: Medicaid Other

## 2022-10-26 ENCOUNTER — Encounter: Payer: Self-pay | Admitting: Women's Health

## 2022-10-26 ENCOUNTER — Ambulatory Visit (INDEPENDENT_AMBULATORY_CARE_PROVIDER_SITE_OTHER): Payer: MEDICAID | Admitting: Women's Health

## 2022-10-26 ENCOUNTER — Other Ambulatory Visit (HOSPITAL_COMMUNITY)
Admission: RE | Admit: 2022-10-26 | Discharge: 2022-10-26 | Disposition: A | Discharge status: Home / Self Care | Payer: MEDICAID | Source: Ambulatory Visit | Attending: Women's Health | Admitting: Women's Health

## 2022-10-26 VITALS — BP 130/77 | HR 56 | Ht 60.0 in | Wt 108.0 lb

## 2022-10-26 DIAGNOSIS — N926 Irregular menstruation, unspecified: Secondary | ICD-10-CM | POA: Insufficient documentation

## 2022-10-26 DIAGNOSIS — R102 Pelvic and perineal pain: Secondary | ICD-10-CM
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
## 2022-10-26 NOTE — Patient Instructions (Addendum)
[facility] Address: [address], [place], [place] [address] Hours: Open 24 hours Phone: [phone]

## 2022-11-01 NOTE — Addendum Note (Signed)
Addended by: Cheral Marker on: 11/01/2022 09:57 AM   Modules accepted: Orders

## 2022-11-03 ENCOUNTER — Encounter: Payer: Self-pay | Admitting: Women's Health

## 2022-11-03 ENCOUNTER — Ambulatory Visit: Payer: MEDICAID

## 2022-11-09 ENCOUNTER — Ambulatory Visit (HOSPITAL_BASED_OUTPATIENT_CLINIC_OR_DEPARTMENT_OTHER)
Admission: RE | Admit: 2022-11-09 | Discharge: 2022-11-09 | Disposition: A | Discharge status: Home / Self Care | Payer: MEDICAID | Source: Ambulatory Visit | Attending: Women's Health | Admitting: Women's Health

## 2022-11-09 DIAGNOSIS — R102 Pelvic and perineal pain: Secondary | ICD-10-CM | POA: Insufficient documentation

## 2022-11-09 DIAGNOSIS — N926 Irregular menstruation, unspecified: Secondary | ICD-10-CM | POA: Diagnosis present

## 2022-11-16 ENCOUNTER — Ambulatory Visit: Payer: MEDICAID | Admitting: Women's Health

## 2023-09-26 ENCOUNTER — Ambulatory Visit
Admission: EM | Admit: 2023-09-26 | Discharge: 2023-09-26 | Disposition: A | Discharge status: Home / Self Care | Payer: MEDICAID | Attending: Nurse Practitioner | Admitting: Nurse Practitioner

## 2023-09-26 DIAGNOSIS — R109 Unspecified abdominal pain: Secondary | ICD-10-CM | POA: Diagnosis not present

## 2023-09-26 LAB — POCT URINALYSIS DIP (MANUAL ENTRY)
Bilirubin, UA: NEGATIVE
Glucose, UA: NEGATIVE mg/dL
Ketones, POC UA: NEGATIVE mg/dL
Nitrite, UA: NEGATIVE
Protein Ur, POC: 30 mg/dL — AB
Spec Grav, UA: 1.025 (ref 1.010–1.025)
Urobilinogen, UA: 1 U/dL
pH, UA: 7 (ref 5.0–8.0)

## 2023-09-26 LAB — POCT URINE PREGNANCY: Preg Test, Ur: NEGATIVE

## 2023-09-26 MED ORDER — SENNOSIDES-DOCUSATE SODIUM 8.6-50 MG PO TABS: 1.0000 | ORAL_TABLET | Freq: Every evening | ORAL | 0 refills | Status: DC | PRN

## 2023-09-26 MED ORDER — TAMSULOSIN HCL 0.4 MG PO CAPS
0.4000 mg | ORAL_CAPSULE | Freq: Every evening | ORAL | 0 refills | Status: DC
Start: 2023-09-26 — End: 2023-10-10

## 2023-09-26 MED ORDER — KETOROLAC TROMETHAMINE 30 MG/ML IJ SOLN
30.0000 mg | Freq: Once | INTRAMUSCULAR | Status: AC
  Administered 2023-09-26: 30 mg via INTRAMUSCULAR

## 2023-09-26 NOTE — Discharge Instructions (Addendum)
 Lab work and urine culture are pending.  You will be contacted if the results of the lab work is abnormal.  You also have access to the results via MyChart. Take medication as prescribed. Make sure you are drinking at least 8-10 8 ounce glasses of water daily while symptoms persist. You may take over-the-counter Tylenol  or ibuprofen  as needed for pain, fever, or general discomfort. Recommend the use of a warm compress or heating pad to the affected area while symptoms persist. As discussed, if your symptoms worsen, or if you develop new symptoms such as fever, chills, nausea, vomiting, or other concerns, please follow-up in the emergency department immediately for further evaluation. Follow-up with PCP as scheduled this week. Follow-up as needed.

## 2023-09-26 NOTE — ED Provider Notes (Signed)
 [facility]    CSN: [id number] Arrival date & time: 09/26/23  1814      History   Chief Complaint Chief Complaint  Patient presents with   Abdominal Pain   Weight Loss    HPI Sydney Higgins is a 28 y.o. female.   The history is provided by the patient.   Patient presents with a 3 to 4-day history of right upper quadrant abdominal pain with radiation into the right shoulder.  Patient denies fever, chills, gas, bloating, urinary frequency, urgency, dysuria, low back pain, or flank pain.  Patient states that she has noticed that she does have some hesitancy when she was try to use the restroom.  She rates her abdominal pain 7/10 at present.  States that she sometimes does have to strain to have a bowel movement.  Patient states her last menstrual cycle was on 09/14/2023.  Denies prior history of kidney stones or kidney infections.  States that she has tried over-the-counter analgesics for pain with minimal relief of her symptoms.  Reports that she has lost approximately 25 pounds in the past 2 months with increased thirst and urination.  She is planning to see her PCP this week.  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 06/30/2020   Vapes nicotine  containing substance 06/18/2020   Marijuana use 06/18/2020   MDD (major depressive disorder) 05/19/2019   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   Constipation 12/17/2015   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   DMDD (disruptive mood  dysregulation disorder) (HCC) 12/11/2015   Insomnia 12/11/2015    Past Surgical History:  Procedure Laterality Date   MASS EXCISION Left 08/26/2017   Procedure: EXCISION SUPERFICIAL MASS FROM LEFT SIDE OF NECK;  Surgeon: Kallie Manuelita BROCKS, MD;  Location: [facility];  Service: General;  Laterality: Left;   WISDOM TOOTH EXTRACTION      OB History     Gravida  2   Para  1   Term  1   Preterm      AB  1   Living  1      SAB  1   IAB      Ectopic      Multiple  0   Live Births  1            Home Medications    Prior to Admission medications   Medication Sig Start Date End Date Taking? Authorizing Provider  amphetamine -dextroamphetamine  (ADDERALL) 10 MG tablet Take by mouth. 06/15/22  Yes [provider]  buPROPion  (WELLBUTRIN  XL) 300 MG 24 hr tablet Take by mouth. 04/28/22  Yes [provider]  hydrOXYzine  (ATARAX ) 25 MG tablet Take by mouth. 04/28/22   [provider]  medroxyPROGESTERone  (DEPO-PROVERA ) 150 MG/ML injection Inject 1 mL (150 mg total) into the muscle every 3 (three) months. 04/08/22   Signa Delon LABOR, NP    Family History Family History  Problem Relation Age of Onset   Cancer  Paternal Grandfather    Cancer Paternal Grandmother    Diabetes Maternal Grandmother    Diabetes Maternal Grandfather    Hypertension Father    Bipolar disorder Brother    Deafness Sister    Diabetes Maternal Uncle     Social History Social History   Tobacco Use   Smoking status: Former    Types: Cigarettes   Smokeless tobacco: Never  Vaping Use   Vaping status: Some Days  Substance Use Topics   Alcohol use: Not Currently   Drug use: Yes    Types: Marijuana    Comment: occ     Allergies   Apple juice, Banana, Bee venom, and Pear   Review of Systems Review of Systems Per HPI  Physical Exam Triage Vital Signs ED Triage Vitals  Encounter Vitals Group     BP 09/26/23 1825 115/78     Girls Systolic BP Percentile --       Girls Diastolic BP Percentile --      Boys Systolic BP Percentile --      Boys Diastolic BP Percentile --      Pulse Rate 09/26/23 1825 82     Resp 09/26/23 1825 16     Temp 09/26/23 1825 98.8 F (37.1 C)     Temp Source 09/26/23 1825 Oral     SpO2 09/26/23 1825 98 %     Weight 09/26/23 1836 107 lb (48.5 kg)     Height --      Head Circumference --      Peak Flow --      Pain Score 09/26/23 1825 8     Pain Loc --      Pain Education --      Exclude from Growth Chart --    No data found.  Updated Vital Signs BP 115/78 (BP Location: Right Arm)   Pulse 82   Temp 98.8 F (37.1 C) (Oral)   Resp 16   Wt 107 lb (48.5 kg)   LMP 09/14/2023 (Exact Date)   SpO2 98%   BMI 20.90 kg/m   Visual Acuity Right Eye Distance:   Left Eye Distance:   Bilateral Distance:    Right Eye Near:   Left Eye Near:    Bilateral Near:     Physical Exam Vitals and nursing note reviewed.  Constitutional:      General: She is not in acute distress.    Appearance: She is well-developed.  HENT:     Head: Normocephalic.   Eyes:     Extraocular Movements: Extraocular movements intact.     Pupils: Pupils are equal, round, and reactive to light.    Cardiovascular:     Rate and Rhythm: Regular rhythm.     Pulses: Normal pulses.     Heart sounds: Normal heart sounds.  Pulmonary:     Effort: Pulmonary effort is normal. No respiratory distress.     Breath sounds: Normal breath sounds. No stridor. No wheezing, rhonchi or rales.  Abdominal:     General: Abdomen is flat. Bowel sounds are normal.     Palpations: Abdomen is soft.     Tenderness: There is abdominal tenderness in the right upper quadrant and right lower quadrant.   Musculoskeletal:     Cervical back: Normal range of motion.   Skin:    General: Skin is warm and dry.   Neurological:     General: No focal deficit present.     Mental Status: She is alert.  Psychiatric:        Mood and Affect: Mood normal.        Behavior:  Behavior normal.      UC Treatments / Results  Labs (all labs ordered are listed, but only abnormal results are displayed) Labs Reviewed  POCT URINALYSIS DIP (MANUAL ENTRY) - Abnormal; Notable for the following components:      Result Value   Color, UA light yellow (*)    Clarity, UA cloudy (*)    Blood, UA moderate (*)    Protein Ur, POC =30 (*)    Leukocytes, UA Small (1+) (*)    All other components within normal limits  POCT URINE PREGNANCY - Normal  COMPREHENSIVE METABOLIC PANEL WITH GFR  CBC WITH DIFFERENTIAL/PLATELET  LIPASE    EKG   Radiology No results found.  Procedures Procedures (including critical care time)  Medications Ordered in UC Medications  ketorolac (TORADOL) 30 MG/ML injection 30 mg (has no administration in time range)    Initial Impression / Assessment and Plan / UC Course  I have reviewed the triage vital signs and the nursing notes.  Pertinent labs & imaging results that were available during my care of the patient were reviewed by me and considered in my medical decision making (see chart for details).  CBC, CMP, and lipase are pending along with a urine culture.  Urinalysis is positive for small leukocytes and protein, cannot determine whether or not the blood is related to her menstrual cycle or if this may be a possible kidney stone.  Cannot rule out kidney stone at this time, will also treat for constipation.  Tamsulosin 0.4 mg prescribed for possible kidney stone along with Senokot for constipation.  Supportive care recommendations were provided and discussed with the patient to include fluids, rest, continuing over-the-counter analgesics, and voiding every 2 hours at a minimum.  Patient was given strict ER follow-up precautions if symptoms worsen prior to seeing her PCP.  Patient was in agreement with this plan of care and verbalizes understanding.  All questions were answered.  Patient stable for discharge.  Final Clinical Impressions(s) /  UC Diagnoses   Final diagnoses:  Abdominal pain, unspecified abdominal location   Discharge Instructions   None    ED Prescriptions   None    PDMP not reviewed this encounter.   Gilmer Etta PARAS, NP 09/26/23 [phone]

## 2023-09-26 NOTE — ED Triage Notes (Signed)
 RUQ pain that goes up to her right shoulder x 3 days.  Pt states she has lost about 25 lbs in the past 2 months, with increased thirst and urination. Pt states she feels hot and overheated when she eats.

## 2023-09-27 ENCOUNTER — Ambulatory Visit (HOSPITAL_COMMUNITY): Payer: Self-pay

## 2023-09-27 LAB — COMPREHENSIVE METABOLIC PANEL WITH GFR
ALT: 12 IU/L (ref 0–32)
AST: 19 IU/L (ref 0–40)
Albumin: 4 g/dL (ref 4.0–5.0)
Alkaline Phosphatase: 62 IU/L (ref 44–121)
BUN/Creatinine Ratio: 16 (ref 9–23)
BUN: 13 mg/dL (ref 6–20)
Bilirubin Total: <0.2 mg/dL (ref 0.0–1.2)
CO2: 22 mmol/L (ref 20–29)
Calcium: 9.3 mg/dL (ref 8.7–10.2)
Chloride: 104 mmol/L (ref 96–106)
Creatinine, Ser: 0.81 mg/dL (ref 0.57–1.00)
Globulin, Total: 2.5 g/dL (ref 1.5–4.5)
Glucose: 76 mg/dL (ref 70–99)
Potassium: 4.1 mmol/L (ref 3.5–5.2)
Sodium: 140 mmol/L (ref 134–144)
Total Protein: 6.5 g/dL (ref 6.0–8.5)
eGFR: 103 mL/min/{1.73_m2} (ref >59)

## 2023-09-27 LAB — CBC WITH DIFFERENTIAL/PLATELET
Basophils Absolute: 0.1 10*3/uL (ref 0.0–0.2)
Basos: 1 %
EOS (ABSOLUTE): 0.2 10*3/uL (ref 0.0–0.4)
Eos: 3 %
Hematocrit: 37 % (ref 34.0–46.6)
Hemoglobin: 11.8 g/dL (ref 11.1–15.9)
Immature Grans (Abs): 0 10*3/uL (ref 0.0–0.1)
Immature Granulocytes: 0 %
Lymphocytes Absolute: 1.5 10*3/uL (ref 0.7–3.1)
Lymphs: 19 %
MCH: 28.3 pg (ref 26.6–33.0)
MCHC: 31.9 g/dL (ref 31.5–35.7)
MCV: 89 fL (ref 79–97)
Monocytes Absolute: 0.9 10*3/uL (ref 0.1–0.9)
Monocytes: 11 %
Neutrophils Absolute: 5.3 10*3/uL (ref 1.4–7.0)
Neutrophils: 66 %
Platelets: 203 10*3/uL (ref 150–450)
RBC: 4.17 x10E6/uL (ref 3.77–5.28)
RDW: 13.1 % (ref 11.7–15.4)
WBC: 8.1 10*3/uL (ref 3.4–10.8)

## 2023-09-27 LAB — LIPASE: Lipase: 37 U/L (ref 14–72)

## 2023-09-28 ENCOUNTER — Other Ambulatory Visit: Payer: Self-pay

## 2023-09-28 ENCOUNTER — Encounter (HOSPITAL_COMMUNITY): Payer: Self-pay

## 2023-09-28 ENCOUNTER — Emergency Department (HOSPITAL_COMMUNITY): Payer: MEDICAID

## 2023-09-28 ENCOUNTER — Emergency Department (HOSPITAL_COMMUNITY)
Admission: EM | Admit: 2023-09-28 | Discharge: 2023-09-29 | Disposition: A | Discharge status: Home / Self Care | Payer: MEDICAID | Attending: Emergency Medicine | Admitting: Emergency Medicine

## 2023-09-28 DIAGNOSIS — R1011 Right upper quadrant pain: Secondary | ICD-10-CM | POA: Diagnosis present

## 2023-09-28 DIAGNOSIS — N12 Tubulo-interstitial nephritis, not specified as acute or chronic: Secondary | ICD-10-CM | POA: Insufficient documentation

## 2023-09-28 DIAGNOSIS — Z87891 Personal history of nicotine dependence: Secondary | ICD-10-CM | POA: Diagnosis not present

## 2023-09-28 LAB — URINALYSIS, ROUTINE W REFLEX MICROSCOPIC
Bilirubin Urine: NEGATIVE
Glucose, UA: NEGATIVE mg/dL
Ketones, ur: NEGATIVE mg/dL
Nitrite: NEGATIVE
Protein, ur: NEGATIVE mg/dL
Specific Gravity, Urine: 1.011 (ref 1.005–1.030)
WBC, UA: >50 WBC/hpf (ref 0–5)
pH: 7 (ref 5.0–8.0)

## 2023-09-28 LAB — COMPREHENSIVE METABOLIC PANEL WITH GFR
ALT: 14 U/L (ref 0–44)
AST: 16 U/L (ref 15–41)
Albumin: 3.7 g/dL (ref 3.5–5.0)
Alkaline Phosphatase: 49 U/L (ref 38–126)
Anion gap: 9 (ref 5–15)
BUN: 7 mg/dL (ref 6–20)
CO2: 24 mmol/L (ref 22–32)
Calcium: 8.8 mg/dL — ABNORMAL LOW (ref 8.9–10.3)
Chloride: 104 mmol/L (ref 98–111)
Creatinine, Ser: 0.7 mg/dL (ref 0.44–1.00)
GFR, Estimated: >60 mL/min (ref >60)
Glucose, Bld: 95 mg/dL (ref 70–99)
Potassium: 3.1 mmol/L — ABNORMAL LOW (ref 3.5–5.1)
Sodium: 137 mmol/L (ref 135–145)
Total Bilirubin: 0.2 mg/dL (ref 0.0–1.2)
Total Protein: 7.5 g/dL (ref 6.5–8.1)

## 2023-09-28 LAB — CBC WITH DIFFERENTIAL/PLATELET
Abs Immature Granulocytes: 0.02 10*3/uL (ref 0.00–0.07)
Basophils Absolute: 0 10*3/uL (ref 0.0–0.1)
Basophils Relative: 1 %
Eosinophils Absolute: 0.1 10*3/uL (ref 0.0–0.5)
Eosinophils Relative: 1 %
HCT: 37.8 % (ref 36.0–46.0)
Hemoglobin: 11.8 g/dL — ABNORMAL LOW (ref 12.0–15.0)
Immature Granulocytes: 0 %
Lymphocytes Relative: 22 %
Lymphs Abs: 1.5 10*3/uL (ref 0.7–4.0)
MCH: 27.5 pg (ref 26.0–34.0)
MCHC: 31.2 g/dL (ref 30.0–36.0)
MCV: 88.1 fL (ref 80.0–100.0)
Monocytes Absolute: 0.8 10*3/uL (ref 0.1–1.0)
Monocytes Relative: 11 %
Neutro Abs: 4.6 10*3/uL (ref 1.7–7.7)
Neutrophils Relative %: 65 %
Platelets: 227 10*3/uL (ref 150–400)
RBC: 4.29 MIL/uL (ref 3.87–5.11)
RDW: 14.1 % (ref 11.5–15.5)
WBC: 7.1 10*3/uL (ref 4.0–10.5)
nRBC: 0 % (ref 0.0–0.2)

## 2023-09-28 LAB — PREGNANCY, URINE: Preg Test, Ur: NEGATIVE

## 2023-09-28 LAB — LIPASE, BLOOD: Lipase: 32 U/L (ref 11–51)

## 2023-09-28 MED ORDER — POTASSIUM CHLORIDE CRYS ER 20 MEQ PO TBCR
40.0000 meq | EXTENDED_RELEASE_TABLET | Freq: Once | ORAL | Status: AC
  Administered 2023-09-28: 40 meq via ORAL
  Filled 2023-09-28: qty 2

## 2023-09-28 MED ORDER — ONDANSETRON 4 MG PO TBDP: 4.0000 mg | ORAL_TABLET | Freq: Three times a day (TID) | ORAL | 0 refills | Status: AC | PRN

## 2023-09-28 MED ORDER — SODIUM CHLORIDE 0.9 % IV SOLN
1.0000 g | Freq: Once | INTRAVENOUS | Status: AC
  Administered 2023-09-28: 1 g via INTRAVENOUS
  Filled 2023-09-28: qty 10

## 2023-09-28 MED ORDER — LACTATED RINGERS IV BOLUS
1000.0000 mL | Freq: Once | INTRAVENOUS | Status: AC
  Administered 2023-09-28: 1000 mL via INTRAVENOUS

## 2023-09-28 MED ORDER — KETOROLAC TROMETHAMINE 15 MG/ML IJ SOLN: 15.0000 mg | Freq: Once | INTRAMUSCULAR | Status: DC

## 2023-09-28 MED ORDER — OXYCODONE HCL 5 MG PO TABS
5.0000 mg | ORAL_TABLET | Freq: Once | ORAL | Status: AC
  Administered 2023-09-28: 5 mg via ORAL
  Filled 2023-09-28: qty 1

## 2023-09-28 MED ORDER — OXYCODONE HCL 5 MG PO TABS: 5.0000 mg | ORAL_TABLET | ORAL | 0 refills | Status: AC | PRN

## 2023-09-28 MED ORDER — CEFDINIR 300 MG PO CAPS
300.0000 mg | ORAL_CAPSULE | Freq: Two times a day (BID) | ORAL | 0 refills | Status: AC
Start: 2023-09-28 — End: 2023-10-05

## 2023-09-28 MED ORDER — KETOROLAC TROMETHAMINE 15 MG/ML IJ SOLN
15.0000 mg | Freq: Once | INTRAMUSCULAR | Status: AC
  Administered 2023-09-28: 15 mg via INTRAVENOUS
  Filled 2023-09-28: qty 1

## 2023-09-28 MED ORDER — IBUPROFEN 600 MG PO TABS: 600.0000 mg | ORAL_TABLET | Freq: Four times a day (QID) | ORAL | 0 refills | Status: AC | PRN

## 2023-09-28 MED ORDER — ONDANSETRON HCL 4 MG/2ML IJ SOLN
4.0000 mg | Freq: Once | INTRAMUSCULAR | Status: AC
  Administered 2023-09-28: 4 mg via INTRAVENOUS
  Filled 2023-09-28: qty 2

## 2023-09-28 NOTE — ED Notes (Signed)
Dr. Scheving at bedside 

## 2023-09-28 NOTE — ED Provider Notes (Signed)
 [place] EMERGENCY DEPARTMENT AT [facility] Provider Note  CSN: [id number] Arrival date & time: 09/28/23 1754  Chief Complaint(s) Flank Pain  HPI Sydney Higgins is a 28 y.o. female without relevant past medical history presenting to the emergency department with right-sided flank pain.  She reports right-sided flank pain for around 5 days also some right upper quadrant pain.  Reports the pain radiates around to her abdomen.  She has been having some increased urinary frequency and foul-smelling urine, but no dysuria.  Denies pelvic pain.  Reports the pain comes and goes.  No vomiting but reports nausea.  No fevers or chills.  No diarrhea.  Denies similar episode.   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   02/19/2019>Repeat in 1 year per [facility],    UTI (urinary tract infection)    Patient Active Problem List   Diagnosis Date Noted   Anxiety and depression 04/08/2022   Abnormal chromosomal and genetic finding on antenatal screening mother 06/30/2020   Vapes nicotine  containing substance 06/18/2020   Marijuana use 06/18/2020   MDD (major depressive disorder) 05/19/2019   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 Authorizing Provider  cefdinir (OMNICEF) 300 MG capsule Take 1 capsule (300 mg total) by mouth 2 (two) times daily for 7 days. 09/28/23 10/05/23 Yes Francesca Elsie CROME, MD  ibuprofen  (ADVIL ) 600 MG tablet Take 1 tablet (600 mg total) by mouth every 6 (six)  hours as needed. 09/28/23  Yes Francesca Elsie CROME, MD  ondansetron  (ZOFRAN -ODT) 4 MG disintegrating tablet Take 1 tablet (4 mg total) by mouth every 8 (eight) hours as needed for nausea or vomiting. 09/28/23  Yes Francesca Elsie CROME, MD  oxyCODONE  (ROXICODONE ) 5 MG immediate release tablet Take 1 tablet (5 mg total) by mouth every 4 (four) hours as needed for severe pain (pain score 7-10). 09/28/23  Yes Francesca Elsie CROME, MD  amphetamine -dextroamphetamine  (ADDERALL) 10 MG tablet Take by mouth. 06/15/22   [provider]  buPROPion  (WELLBUTRIN  XL) 300 MG 24 hr tablet Take by mouth. 04/28/22   [provider]  hydrOXYzine  (ATARAX ) 25 MG tablet Take by mouth. 04/28/22   [provider]  medroxyPROGESTERone  (DEPO-PROVERA ) 150 MG/ML injection Inject 1 mL (150 mg total) into the muscle every 3 (three) months. 04/08/22   Signa Delon LABOR, NP  senna-docusate (SENOKOT-S) 8.6-50 MG tablet Take 1 tablet by mouth at bedtime as needed for mild constipation. 09/26/23   Leath-Warren, Etta PARAS, NP  tamsulosin (FLOMAX) 0.4 MG CAPS capsule Take 1 capsule (0.4 mg total) by mouth at bedtime. 09/26/23   Leath-Warren, Etta PARAS, NP  Past Surgical History Past Surgical History:  Procedure Laterality Date   MASS EXCISION Left 08/26/2017   Procedure: EXCISION SUPERFICIAL MASS FROM LEFT SIDE OF NECK;  Surgeon: Kallie Manuelita BROCKS, MD;  Location: [facility];  Service: General;  Laterality: Left;   WISDOM TOOTH EXTRACTION     Family History Family History  Problem Relation Age of Onset   Cancer Paternal Grandfather    Cancer Paternal Grandmother    Diabetes Maternal Grandmother    Diabetes Maternal Grandfather    Hypertension Father    Bipolar disorder Brother    Deafness Sister    Diabetes Maternal Uncle     Social History Social History   Tobacco Use   Smoking  status: Former    Types: Cigarettes   Smokeless tobacco: Never  Vaping Use   Vaping status: Some Days  Substance Use Topics   Alcohol use: Not Currently   Drug use: Yes    Types: Marijuana    Comment: occ   Allergies Apple juice, Banana, Bee venom, and Pear  Review of Systems Review of Systems  All other systems reviewed and are negative.   Physical Exam Vital Signs  I have reviewed the triage vital signs BP 107/68   Pulse (!) 57   Temp 98 F (36.7 C)   Resp 20   Ht 5' (1.524 m)   Wt 48.5 kg   LMP 09/14/2023 (Exact Date)   SpO2 98%   BMI 20.90 kg/m  Physical Exam Vitals and nursing note reviewed.  Constitutional:      General: She is not in acute distress.    Appearance: She is well-developed.  HENT:     Head: Normocephalic and atraumatic.     Mouth/Throat:     Mouth: Mucous membranes are moist.  Eyes:     Pupils: Pupils are equal, round, and reactive to light.  Cardiovascular:     Rate and Rhythm: Normal rate and regular rhythm.     Heart sounds: No murmur heard. Pulmonary:     Effort: Pulmonary effort is normal. No respiratory distress.     Breath sounds: Normal breath sounds.  Abdominal:     General: Abdomen is flat.     Palpations: Abdomen is soft.     Tenderness: There is no abdominal tenderness. There is right CVA tenderness.  Musculoskeletal:        General: No tenderness.     Right lower leg: No edema.     Left lower leg: No edema.  Skin:    General: Skin is warm and dry.  Neurological:     General: No focal deficit present.     Mental Status: She is alert. Mental status is at baseline.  Psychiatric:        Mood and Affect: Mood normal.        Behavior: Behavior normal.     ED Results and Treatments Labs (all labs ordered are listed, but only abnormal results are displayed) Labs Reviewed  URINALYSIS, ROUTINE W REFLEX MICROSCOPIC - Abnormal; Notable for the following components:      Result Value   APPearance HAZY (*)    Hgb urine  dipstick MODERATE (*)    Leukocytes,Ua LARGE (*)    Bacteria, UA FEW (*)    All other components within normal limits  COMPREHENSIVE METABOLIC PANEL WITH GFR - Abnormal; Notable for the following components:   Potassium 3.1 (*)    Calcium 8.8 (*)    All other components within normal limits  CBC WITH DIFFERENTIAL/PLATELET - Abnormal; Notable for the following components:   Hemoglobin 11.8 (*)    All other components within normal limits  LIPASE, BLOOD  PREGNANCY, URINE                                                                                                                          Radiology CT Renal Stone Study Result Date: 09/28/2023 CLINICAL DATA:  Right flank pain x4 days. EXAM: CT ABDOMEN AND PELVIS WITHOUT CONTRAST TECHNIQUE: Multidetector CT imaging of the abdomen and pelvis was performed following the standard protocol without IV contrast. RADIATION DOSE REDUCTION: This exam was performed according to the departmental dose-optimization program which includes automated exposure control, adjustment of the mA and/or kV according to patient size and/or use of iterative reconstruction technique. COMPARISON:  None Available. FINDINGS: Lower chest: No acute abnormality. Hepatobiliary: No focal liver abnormality is seen. No gallstones, gallbladder wall thickening, or biliary dilatation. Pancreas: Unremarkable. No pancreatic ductal dilatation or surrounding inflammatory changes. Spleen: Normal in size without focal abnormality. Adrenals/Urinary Tract: Adrenal glands are unremarkable. Kidneys are normal, without renal calculi, focal lesion, or hydronephrosis. Bladder is unremarkable. Stomach/Bowel: Stomach is within normal limits. Appendix appears normal. A large amount of stool is seen throughout the colon. No evidence of bowel wall thickening, distention, or inflammatory changes. Vascular/Lymphatic: No significant vascular findings are present. No enlarged abdominal or pelvic lymph nodes.  Reproductive: A 3.2 cm right adnexal cyst is seen. The uterus and left adnexa are unremarkable. Other: No abdominal wall hernia or abnormality. A very small amount of posterior pelvic free fluid is noted. Musculoskeletal: No acute or significant osseous findings. IMPRESSION: 1. No evidence of renal calculi or hydronephrosis. 2. Large stool burden without evidence of bowel obstruction. 3. 3.2 cm right adnexal cyst, likely ovarian in origin. No follow-up imaging is recommended. This recommendation follows ACR consensus guidelines: White Paper of the ACR Incidental Findings Committee II on Adnexal Findings. J Am Coll Radiol [phone]. 4. Very small amount of posterior pelvic free fluid, likely physiologic. Electronically Signed   By: Suzen Dials M.D.   On: 09/28/2023 22:34    Pertinent labs & imaging results that were available during my care of the patient were reviewed by me and considered in my medical decision making (see MDM for details).  Medications Ordered in ED Medications  potassium chloride  SA (KLOR-CON  M) CR tablet 40 mEq (has no administration in time range)  ketorolac (TORADOL) 15 MG/ML injection 15 mg (15 mg Intravenous Given 09/28/23 2104)  lactated ringers  bolus 1,000 mL (0 mLs Intravenous Stopped 09/28/23 2308)  ondansetron  (ZOFRAN ) injection 4 mg (4 mg Intravenous Given 09/28/23 2104)  cefTRIAXone  (ROCEPHIN ) 1 g in sodium chloride  0.9 % 100 mL IVPB (1 g Intravenous New Bag/Given 09/28/23 2309)  oxyCODONE  (Oxy IR/ROXICODONE ) immediate release tablet 5 mg (5 mg Oral Given 09/28/23 2308)  Procedures Procedures  (including critical care time)  Medical Decision Making / ED Course   MDM:  28 year old presenting to the emergency department with flank pain.  Patient well-appearing, physical exam with right CVA tenderness.  No abdominal or pelvic  tenderness.  Suspect symptoms due to pyelonephritis.  Urinalysis with greater than 50 WBCs, bacteria, leukocytes.  CT scan obtained with no evidence of nephrolithiasis or other intra-abdominal process such as appendicitis.  Does incidentally note right ovarian cyst, however with urinary symptoms, flank pain and CVA tenderness, 5 days of symptoms seem much more consistent with pyelonephritis than ovarian pathology such as torsion or TOA.  Patient reports her symptoms improved significantly after Toradol and she feels much better.  Will give dose of ceftriaxone , treat with cefdinir, oxycodone  as needed for pain.  Reviewed PDMP.  Discussed safe use of medicine.  Recommended close follow-up with primary physician and strict return precautions for any worsening.  Discussed findings including ovarian cyst with patient.      Additional history obtained: -Additional history obtained from spouse -External records from outside source obtained and reviewed including: Chart review including previous notes, labs, imaging, consultation notes including prior notes    Lab Tests: -I ordered, reviewed, and interpreted labs.   The pertinent results include:   Labs Reviewed  URINALYSIS, ROUTINE W REFLEX MICROSCOPIC - Abnormal; Notable for the following components:      Result Value   APPearance HAZY (*)    Hgb urine dipstick MODERATE (*)    Leukocytes,Ua LARGE (*)    Bacteria, UA FEW (*)    All other components within normal limits  COMPREHENSIVE METABOLIC PANEL WITH GFR - Abnormal; Notable for the following components:   Potassium 3.1 (*)    Calcium 8.8 (*)    All other components within normal limits  CBC WITH DIFFERENTIAL/PLATELET - Abnormal; Notable for the following components:   Hemoglobin 11.8 (*)    All other components within normal limits  LIPASE, BLOOD  PREGNANCY, URINE    Notable for signs of UTI    Imaging Studies ordered: I ordered imaging studies including CT abdomen On my  interpretation imaging demonstrates no acute process, ovarian cyst  I independently visualized and interpreted imaging. I agree with the radiologist interpretation   Medicines ordered and prescription drug management: Meds ordered this encounter  Medications   DISCONTD: ketorolac (TORADOL) 15 MG/ML injection 15 mg   ketorolac (TORADOL) 15 MG/ML injection 15 mg   lactated ringers  bolus 1,000 mL   ondansetron  (ZOFRAN ) injection 4 mg   cefTRIAXone  (ROCEPHIN ) 1 g in sodium chloride  0.9 % 100 mL IVPB    Antibiotic Indication::   UTI   oxyCODONE  (Oxy IR/ROXICODONE ) immediate release tablet 5 mg    Refill:  0   oxyCODONE  (ROXICODONE ) 5 MG immediate release tablet    Sig: Take 1 tablet (5 mg total) by mouth every 4 (four) hours as needed for severe pain (pain score 7-10).    Dispense:  12 tablet    Refill:  0   cefdinir (OMNICEF) 300 MG capsule    Sig: Take 1 capsule (300 mg total) by mouth 2 (two) times daily for 7 days.    Dispense:  14 capsule    Refill:  0   ondansetron  (ZOFRAN -ODT) 4 MG disintegrating tablet    Sig: Take 1 tablet (4 mg total) by mouth every 8 (eight) hours as needed for nausea or vomiting.    Dispense:  20 tablet    Refill:  0  ibuprofen  (ADVIL ) 600 MG tablet    Sig: Take 1 tablet (600 mg total) by mouth every 6 (six) hours as needed.    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    Final Clinical Impression(s) / ED Diagnoses Final diagnoses:  Pyelonephritis     This chart was dictated using voice recognition software.  Despite best efforts to proofread,  errors can occur which can change the documentation meaning.    Francesca Elsie CROME, MD 09/28/23 [phone]

## 2023-09-28 NOTE — ED Notes (Signed)
 Patient transported to CT

## 2023-09-28 NOTE — Telephone Encounter (Signed)
 Copied from CRM #[id number]. Topic: Schedule Appointment - Schedule Patient >> Sep 28, 2023  1:35 PM Sabrina W wrote: Charlene/Mother is calling other request    Include all details related to the request(s) below: Patient mother calling to advise that patient was on the way to her appointment and her car broke down. Wanted to see if she would still be able to be seen if she arrived about 30  minutes late. I advised o the 15 minute grace period, but she just wanted to be advised still.     Confirm and type the Best Contact Number below:  Patient/caller contact number:  [phone]           [] Home  [x] Mobile  [] Work [] Other   [] Okay to leave a voicemail   Medication List:  Current Outpatient Medications:  .  buPROPion  (WELLBUTRIN  XL) 150 mg 24 hr tablet, Take 1 tablet (150 mg total) by mouth daily Indications: depression., Disp: 90 tablet, Rfl: 0 .  dextroamphetamine -amphetamine  (ADDERALL) 10 mg tablet, Take 1 tablet (10 mg total) by mouth 2 (two) times a day., Disp: 60 tablet, Rfl: 0 .  ergocalciferol  (Vitamin D2) 1,250 mcg (50,000 unit) capsule, Take 1 capsule (50,000 Units total) by mouth once a week., Disp: 12 capsule, Rfl: 0     Medication Request/Refills: Pharmacy Information (if applicable)   [x] Not Applicable       []  Pharmacy listed  Send Medication Request to:                                                 [] Pharmacy not listed (added to pharmacy list in Epic) Send Medication Request to:      Listed Pharmacies: [id number] - [place], [place] - [address] AT [facility] - PHONE: [phone] - FAX: [phone]

## 2023-09-28 NOTE — Discharge Instructions (Addendum)
 We evaluated you for your flank pain.  Your CT scan was negative.  It did show an ovarian cyst, we do not think this is the cause of your symptoms.  Your urine test and physical examination was suggestive of a kidney infection (pyelonephritis).  We gave you dose of antibiotics in the emergency department.  We have prescribed antibiotics to take at home.  Please take these as prescribed and follow-up with your primary physician.  Please take Tylenol  (acetaminophen ) and Motrin  (ibuprofen ) for your symptoms at home.  You can take 1000 mg of Tylenol  every 6 hours and 600 mg of Motrin  every 6 hours as needed for your symptoms.  You can take these medicines together as needed, either at the same time, or alternating every 3 hours.  Can also take oxycodone  if needed for severe pain.  We have prescribed you a small amount of this.  Do not drink alcohol or drive when taking this medication.  We have also prescribed you medication for nausea if needed.  If you have any new or worsening symptoms such as severe uncontrolled pain, fevers, uncontrolled vomiting, lightheadedness or dizziness, increasing weakness, fainting, pelvic pain, or any other concerning symptoms, please return to the emergency department.

## 2023-09-28 NOTE — ED Notes (Signed)
 The patient is crying, yelling out and on hands and knees and on all 4's on the stretcher stating she is in pain. Dr. Francesca informed.

## 2023-09-28 NOTE — ED Triage Notes (Signed)
 Pt arrived via POV from home c/o rigth flank pain X 4 days, and reports hematuria. Pt reports last BM was 1 week ago. Pt reports pain moves to her shoulder. Pt reports decreased appetite, does not want to drink fluids and last night felt SOB.

## 2023-09-29 NOTE — ED Notes (Signed)
..  The patient is A&OX4, ambulatory at d/c with independent steady gait, NAD. Pt verbalized understanding of d/c instructions, prescriptions and follow up care.

## 2023-10-07 ENCOUNTER — Emergency Department (HOSPITAL_COMMUNITY)
Admission: EM | Admit: 2023-10-07 | Discharge: 2023-10-07 | Discharge status: Against Medical Advice | Payer: MEDICAID | Attending: Emergency Medicine | Admitting: Emergency Medicine

## 2023-10-07 ENCOUNTER — Encounter (HOSPITAL_COMMUNITY): Payer: Self-pay

## 2023-10-07 ENCOUNTER — Other Ambulatory Visit: Payer: Self-pay

## 2023-10-07 DIAGNOSIS — R112 Nausea with vomiting, unspecified: Secondary | ICD-10-CM | POA: Insufficient documentation

## 2023-10-07 DIAGNOSIS — R319 Hematuria, unspecified: Secondary | ICD-10-CM | POA: Insufficient documentation

## 2023-10-07 DIAGNOSIS — Z5321 Procedure and treatment not carried out due to patient leaving prior to being seen by health care provider: Secondary | ICD-10-CM | POA: Insufficient documentation

## 2023-10-07 DIAGNOSIS — R109 Unspecified abdominal pain: Secondary | ICD-10-CM | POA: Diagnosis present

## 2023-10-07 LAB — COMPREHENSIVE METABOLIC PANEL WITH GFR
ALT: 11 U/L (ref 0–44)
AST: 15 U/L (ref 15–41)
Albumin: 3 g/dL — ABNORMAL LOW (ref 3.5–5.0)
Alkaline Phosphatase: 46 U/L (ref 38–126)
Anion gap: 9 (ref 5–15)
BUN: 8 mg/dL (ref 6–20)
CO2: 24 mmol/L (ref 22–32)
Calcium: 9.1 mg/dL (ref 8.9–10.3)
Chloride: 104 mmol/L (ref 98–111)
Creatinine, Ser: 0.7 mg/dL (ref 0.44–1.00)
GFR, Estimated: >60 mL/min (ref >60)
Glucose, Bld: 91 mg/dL (ref 70–99)
Potassium: 4 mmol/L (ref 3.5–5.1)
Sodium: 137 mmol/L (ref 135–145)
Total Bilirubin: 0.2 mg/dL (ref 0.0–1.2)
Total Protein: 7.1 g/dL (ref 6.5–8.1)

## 2023-10-07 LAB — CBC
HCT: 33.3 % — ABNORMAL LOW (ref 36.0–46.0)
Hemoglobin: 10.5 g/dL — ABNORMAL LOW (ref 12.0–15.0)
MCH: 27.9 pg (ref 26.0–34.0)
MCHC: 31.5 g/dL (ref 30.0–36.0)
MCV: 88.3 fL (ref 80.0–100.0)
Platelets: 349 K/uL (ref 150–400)
RBC: 3.77 MIL/uL — ABNORMAL LOW (ref 3.87–5.11)
RDW: 13.9 % (ref 11.5–15.5)
WBC: 9.1 K/uL (ref 4.0–10.5)
nRBC: 0 % (ref 0.0–0.2)

## 2023-10-07 LAB — HCG, SERUM, QUALITATIVE: Preg, Serum: NEGATIVE

## 2023-10-07 LAB — LIPASE, BLOOD: Lipase: 50 U/L (ref 11–51)

## 2023-10-07 NOTE — ED Notes (Signed)
 This RN notified by NT that pt informed her she was leaving due to wait times and needing to pick up child.

## 2023-10-07 NOTE — ED Triage Notes (Signed)
 Pt to ED c/o right flank pain radiating to abdomen x 2 weeks. Evaluated at [facility] for the same a week ago and dx with pyelonephritis Pt completed abx 2 days ago. Reports hematuria. Last BM yesterday, also reports N/V.

## 2023-10-08 ENCOUNTER — Encounter (HOSPITAL_COMMUNITY): Payer: Self-pay

## 2023-10-08 ENCOUNTER — Emergency Department (HOSPITAL_COMMUNITY)
Admission: EM | Admit: 2023-10-08 | Discharge: 2023-10-08 | Disposition: A | Discharge status: Home / Self Care | Payer: MEDICAID | Attending: Emergency Medicine | Admitting: Emergency Medicine

## 2023-10-08 DIAGNOSIS — N939 Abnormal uterine and vaginal bleeding, unspecified: Secondary | ICD-10-CM | POA: Insufficient documentation

## 2023-10-08 DIAGNOSIS — R109 Unspecified abdominal pain: Secondary | ICD-10-CM | POA: Diagnosis present

## 2023-10-08 LAB — BASIC METABOLIC PANEL WITH GFR
Anion gap: 9 (ref 5–15)
BUN: 12 mg/dL (ref 6–20)
CO2: 24 mmol/L (ref 22–32)
Calcium: 8.8 mg/dL — ABNORMAL LOW (ref 8.9–10.3)
Chloride: 104 mmol/L (ref 98–111)
Creatinine, Ser: 0.65 mg/dL (ref 0.44–1.00)
GFR, Estimated: >60 mL/min (ref >60)
Glucose, Bld: 97 mg/dL (ref 70–99)
Potassium: 4.1 mmol/L (ref 3.5–5.1)
Sodium: 137 mmol/L (ref 135–145)

## 2023-10-08 LAB — CBC WITH DIFFERENTIAL/PLATELET
Abs Immature Granulocytes: 0.1 K/uL — ABNORMAL HIGH (ref 0.00–0.07)
Basophils Absolute: 0 K/uL (ref 0.0–0.1)
Basophils Relative: 0 %
Eosinophils Absolute: 0.2 K/uL (ref 0.0–0.5)
Eosinophils Relative: 2 %
HCT: 34.2 % — ABNORMAL LOW (ref 36.0–46.0)
Hemoglobin: 11 g/dL — ABNORMAL LOW (ref 12.0–15.0)
Lymphocytes Relative: 27 %
Lymphs Abs: 2.3 K/uL (ref 0.7–4.0)
MCH: 28.1 pg (ref 26.0–34.0)
MCHC: 32.2 g/dL (ref 30.0–36.0)
MCV: 87.2 fL (ref 80.0–100.0)
Metamyelocytes Relative: 1 %
Monocytes Absolute: 0.6 K/uL (ref 0.1–1.0)
Monocytes Relative: 7 %
Neutro Abs: 5.5 K/uL (ref 1.7–7.7)
Neutrophils Relative %: 63 %
Platelets: 346 K/uL (ref 150–400)
RBC: 3.92 MIL/uL (ref 3.87–5.11)
RDW: 14.2 % (ref 11.5–15.5)
Smear Review: NORMAL
WBC: 8.7 K/uL (ref 4.0–10.5)
nRBC: 0 % (ref 0.0–0.2)

## 2023-10-08 LAB — URINALYSIS, ROUTINE W REFLEX MICROSCOPIC
Bilirubin Urine: NEGATIVE
Glucose, UA: NEGATIVE mg/dL
Ketones, ur: NEGATIVE mg/dL
Leukocytes,Ua: NEGATIVE
Nitrite: NEGATIVE
Protein, ur: 30 mg/dL — AB
RBC / HPF: >50 RBC/hpf (ref 0–5)
Specific Gravity, Urine: 1.01 (ref 1.005–1.030)
WBC, UA: >50 WBC/hpf (ref 0–5)
pH: 7 (ref 5.0–8.0)

## 2023-10-08 LAB — WET PREP, GENITAL
Sperm: NONE SEEN
Trich, Wet Prep: NONE SEEN
WBC, Wet Prep HPF POC: <10 (ref <10)
Yeast Wet Prep HPF POC: NONE SEEN

## 2023-10-08 LAB — HCG, SERUM, QUALITATIVE: Preg, Serum: NEGATIVE

## 2023-10-08 MED ORDER — MEDROXYPROGESTERONE ACETATE 5 MG PO TABS: 5.0000 mg | ORAL_TABLET | Freq: Every day | ORAL | 0 refills | Status: AC

## 2023-10-08 MED ORDER — KETOROLAC TROMETHAMINE 30 MG/ML IJ SOLN
15.0000 mg | Freq: Once | INTRAMUSCULAR | Status: AC
  Administered 2023-10-08: 15 mg via INTRAVENOUS
  Filled 2023-10-08: qty 1

## 2023-10-08 MED ORDER — FOSFOMYCIN TROMETHAMINE 3 G PO PACK
3.0000 g | PACK | Freq: Once | ORAL | Status: AC
  Administered 2023-10-08: 3 g via ORAL
  Filled 2023-10-08: qty 3

## 2023-10-08 MED ORDER — DOCUSATE SODIUM 100 MG PO CAPS: 100.0000 mg | ORAL_CAPSULE | Freq: Two times a day (BID) | ORAL | 0 refills | Status: DC

## 2023-10-08 NOTE — ED Provider Notes (Signed)
 [place] [facility]MENT AT [facility] Provider Note   CSN: [id number] Arrival date & time: 10/08/23  0408     Patient presents with: Abdominal Pain   Sydney Higgins is a 28 y.o. female.   Patient presents to the emergency department with complaints of diffuse left-sided abdominal pain and cramping.  Patient reports that she continues to have vaginal bleeding, has been bleeding for 14 days.       Prior to Admission medications   Medication Sig Start Date End Date Taking? Authorizing Provider  docusate sodium  (COLACE) 100 MG capsule Take 1 capsule (100 mg total) by mouth every 12 (twelve) hours. 10/08/23  Yes Geniva Lohnes, Lonni PARAS, MD  medroxyPROGESTERone  (PROVERA ) 5 MG tablet Take 1 tablet (5 mg total) by mouth daily. 10/08/23  Yes Corliss Coggeshall, Lonni PARAS, MD  amphetamine -dextroamphetamine  (ADDERALL) 10 MG tablet Take by mouth. 06/15/22   [provider]  buPROPion  (WELLBUTRIN  XL) 300 MG 24 hr tablet Take by mouth. 04/28/22   [provider]  hydrOXYzine  (ATARAX ) 25 MG tablet Take by mouth. 04/28/22   [provider]  ibuprofen  (ADVIL ) 600 MG tablet Take 1 tablet (600 mg total) by mouth every 6 (six) hours as needed. 09/28/23   Francesca Elsie CROME, MD  ondansetron  (ZOFRAN -ODT) 4 MG disintegrating tablet Take 1 tablet (4 mg total) by mouth every 8 (eight) hours as needed for nausea or vomiting. 09/28/23   Francesca Elsie CROME, MD  oxyCODONE  (ROXICODONE ) 5 MG immediate release tablet Take 1 tablet (5 mg total) by mouth every 4 (four) hours as needed for severe pain (pain score 7-10). 09/28/23   Francesca Elsie CROME, MD  senna-docusate (SENOKOT-S) 8.6-50 MG tablet Take 1 tablet by mouth at bedtime as needed for mild constipation. 09/26/23   Leath-Warren, Etta PARAS, NP  tamsulosin  (FLOMAX ) 0.4 MG CAPS capsule Take 1 capsule (0.4 mg total) by mouth at bedtime. 09/26/23   Leath-Warren, Etta PARAS, NP    Allergies: Apple juice, Banana, Bee venom, and Pear     Review of Systems  Updated Vital Signs BP 109/78   Pulse 63   Temp 98.5 F (36.9 C)   Resp 17   Ht 4' 11 (1.499 m)   Wt 48.5 kg   LMP 09/14/2023 (Exact Date)   SpO2 97%   BMI 21.61 kg/m   Physical Exam Vitals and nursing note reviewed. Exam conducted with a chaperone present.  Constitutional:      General: She is not in acute distress.    Appearance: She is well-developed.  HENT:     Head: Normocephalic and atraumatic.     Mouth/Throat:     Mouth: Mucous membranes are moist.  Eyes:     General: Vision grossly intact. Gaze aligned appropriately.     Extraocular Movements: Extraocular movements intact.     Conjunctiva/sclera: Conjunctivae normal.  Cardiovascular:     Rate and Rhythm: Normal rate and regular rhythm.     Pulses: Normal pulses.     Heart sounds: Normal heart sounds, S1 normal and S2 normal. No murmur heard.    No friction rub. No gallop.  Pulmonary:     Effort: Pulmonary effort is normal. No respiratory distress.     Breath sounds: Normal breath sounds.  Abdominal:     General: Bowel sounds are normal.     Palpations: Abdomen is soft.     Tenderness: There is abdominal tenderness in the left upper quadrant and left lower quadrant. There is no guarding or rebound.  Hernia: No hernia is present.  Genitourinary:    Labia:        Right: No rash, tenderness or lesion.        Left: No rash, tenderness or lesion.      Vagina: Normal.     Cervix: No cervical motion tenderness, discharge or friability.  Musculoskeletal:        General: No swelling.     Cervical back: Full passive range of motion without pain, normal range of motion and neck supple. No spinous process tenderness or muscular tenderness. Normal range of motion.     Right lower leg: No edema.     Left lower leg: No edema.  Skin:    General: Skin is warm and dry.     Capillary Refill: Capillary refill takes less than 2 seconds.     Findings: No ecchymosis, erythema, rash or wound.   Neurological:     General: No focal deficit present.     Mental Status: She is alert and oriented to person, place, and time.     GCS: GCS eye subscore is 4. GCS verbal subscore is 5. GCS motor subscore is 6.     Cranial Nerves: Cranial nerves 2-12 are intact.     Sensory: Sensation is intact.     Motor: Motor function is intact.     Coordination: Coordination is intact.  Psychiatric:        Attention and Perception: Attention normal.        Mood and Affect: Mood normal.        Speech: Speech normal.        Behavior: Behavior normal.     (all labs ordered are listed, but only abnormal results are displayed) Labs Reviewed  WET PREP, GENITAL - Abnormal; Notable for the following components:      Result Value   Clue Cells Wet Prep HPF POC PRESENT (*)    All other components within normal limits  CBC WITH DIFFERENTIAL/PLATELET - Abnormal; Notable for the following components:   Hemoglobin 11.0 (*)    HCT 34.2 (*)    Abs Immature Granulocytes 0.10 (*)    All other components within normal limits  BASIC METABOLIC PANEL WITH GFR - Abnormal; Notable for the following components:   Calcium 8.8 (*)    All other components within normal limits  URINALYSIS, ROUTINE W REFLEX MICROSCOPIC - Abnormal; Notable for the following components:   Color, Urine PINK (*)    APPearance CLOUDY (*)    Hgb urine dipstick MODERATE (*)    Protein, ur 30 (*)    Bacteria, UA RARE (*)    All other components within normal limits  URINE CULTURE  HCG, SERUM, QUALITATIVE  PATHOLOGIST SMEAR REVIEW  GC/CHLAMYDIA PROBE AMP () NOT AT [facility]    EKG: None  Radiology: No results found.   Procedures   Medications Ordered in the ED  ketorolac  (TORADOL ) 30 MG/ML injection 15 mg (15 mg Intravenous Given 10/08/23 0528)  fosfomycin (MONUROL ) packet 3 g (3 g Oral Given 10/08/23 0553)                                    Medical Decision Making Amount and/or Complexity of Data Reviewed Labs:  ordered.  Risk Prescription drug management.   Presents with persistent abdominal pain.  She reports that she has been having vaginal bleeding for 2 weeks.  Normally she is  very regular with her cycle.  She is not currently on any exogenous hormones.  Hemoglobin has not dropped, no concern for need for transfusion.  Pelvic exam reveals moderate amount of blood and some clots in the vaginal fornix, closed cervix, no cervical discharge.  Patient had a CT scan on July 2 that did not show any acute abnormality.  I do not feel she requires repeat CT scan.  She may benefit from ultrasound but does not need an emergent one.  There is no tech available at the moment here.  Will initiate Provera  and have follow-up with OB/GYN.  Urinalysis today still suggestive of infection.  May be inflammatory.  She did receive a dose of Rocephin  a week ago.  Will await chlamydia swab, low suspicion for STD.  Reports taking the antibiotic that was prescribed.  Will draw her urine today, no culture was performed initially.  Administer fosfomycin which should cover most bugs in an uncomplicated UTI.  CT scan a week ago did show moderate stool burden, some of her left-sided pain may be secondary to constipation.  Will add stool softeners.  NSAIDs for menstrual cramping.     Final diagnoses:  Abnormal uterine bleeding    ED Discharge Orders          Ordered    docusate sodium  (COLACE) 100 MG capsule  Every 12 hours        10/08/23 0602    medroxyPROGESTERone  (PROVERA ) 5 MG tablet  Daily        10/08/23 0602               Haze Lonni PARAS, MD 10/08/23 [phone]

## 2023-10-08 NOTE — ED Triage Notes (Signed)
 Pov from home. Cc of abdominal pain for weeks. Was at [facility] yesterday and [facility] last week

## 2023-10-09 LAB — URINE CULTURE

## 2023-10-10 ENCOUNTER — Encounter: Payer: Self-pay | Admitting: Adult Health

## 2023-10-10 ENCOUNTER — Ambulatory Visit: Payer: MEDICAID | Admitting: Adult Health

## 2023-10-10 VITALS — BP 137/84 | HR 68 | Ht 59.0 in | Wt 107.5 lb

## 2023-10-10 DIAGNOSIS — R030 Elevated blood-pressure reading, without diagnosis of hypertension: Secondary | ICD-10-CM | POA: Diagnosis not present

## 2023-10-10 DIAGNOSIS — N939 Abnormal uterine and vaginal bleeding, unspecified: Secondary | ICD-10-CM

## 2023-10-10 DIAGNOSIS — D509 Iron deficiency anemia, unspecified: Secondary | ICD-10-CM

## 2023-10-10 DIAGNOSIS — A749 Chlamydial infection, unspecified: Secondary | ICD-10-CM | POA: Diagnosis not present

## 2023-10-10 LAB — GC/CHLAMYDIA PROBE AMP (~~LOC~~) NOT AT ARMC
Chlamydia: POSITIVE — AB
Comment: NEGATIVE
Comment: NORMAL
Neisseria Gonorrhea: NEGATIVE

## 2023-10-10 LAB — PATHOLOGIST SMEAR REVIEW

## 2023-10-10 MED ORDER — METRONIDAZOLE 500 MG PO TABS: 500.0000 mg | ORAL_TABLET | Freq: Two times a day (BID) | ORAL | 0 refills | Status: AC

## 2023-10-10 MED ORDER — DOXYCYCLINE HYCLATE 100 MG PO TABS: 100.0000 mg | ORAL_TABLET | Freq: Two times a day (BID) | ORAL | 0 refills | Status: AC

## 2023-10-10 MED ORDER — IRON 325 (65 FE) MG PO TABS: 1.0000 | ORAL_TABLET | Freq: Every day | ORAL | Status: AC

## 2023-10-10 NOTE — Progress Notes (Signed)
 Subjective:     Patient ID: Sydney Higgins, female   DOB: [birth year], 28 y.o.   MRN: [id number]  HPI Sydney Higgins is a 28 year old black female, single, G2P1011 in for follow up on being seen in ER 10/08/23 for bleeding for 2 weeks and abdominal pain, and was prescribed provera . She had + chlamydia come back today and had clue cells on wet prep and was anemic. Had CT 09/28/23, that showed stool burden.      Component Value Date/Time   DIAGPAP  05/19/2021 1128    - Negative for intraepithelial lesion or malignancy (NILM)   DIAGPAP - Low grade squamous intraepithelial lesion (LSIL) (A) 02/14/2019 1004   ADEQPAP  05/19/2021 1128    Satisfactory for evaluation; transformation zone component PRESENT.   ADEQPAP  02/14/2019 1004    Satisfactory for evaluation; transformation zone component PRESENT.    Review of Systems +bleeding for 2 weeks seen in ER 10/08/23 Had abdominal pain too Reviewed past medical,surgical, social and family history. Reviewed medications and allergies.     Objective:   Physical Exam BP 137/84 (BP Location: Left Arm, Patient Position: Sitting, Cuff Size: Normal)   Pulse 68   Ht 4' 11 (1.499 m)   Wt 107 lb 8 oz (48.8 kg)   LMP 09/14/2023 (Exact Date)   BMI 21.71 kg/m      Skin warm and dry.Lungs: clear to ausculation bilaterally. Cardiovascular: regular rate and rhythm.   Upstream - 10/10/23 1523       Pregnancy Intention Screening   Does the patient want to become pregnant in the next year? No    Does the patient's partner want to become pregnant in the next year? No    Would the patient like to discuss contraceptive options today? No      Contraception Wrap Up   Current Method Female Condom    End Method Female Condom          Assessment:     1. Abnormal uterine bleeding (AUB) (Primary) Had vaginal bleeding for 2 weeks, seen in ER 10/08/23 Was prescribed  provera  5 mg 1 daily for 5 days   2. Chlamydia +chlamydia in ER, will rx doxycycline  100 mg 1 bid x 7 days, no  sex. Tell partner he needs to be treated, I can treat, but she says she will tell him to go to health dept  Return in 4 weeks for POC She had clue cells on wet prep in ER will rx flagyl , no sex or alcoho Meds ordered this encounter  Medications   doxycycline  (VIBRA -TABS) 100 MG tablet    Sig: Take 1 tablet (100 mg total) by mouth 2 (two) times daily.    Dispense:  14 tablet    Refill:  0    Supervising Provider:   JAYNE MINDER H [[id number]]   metroNIDAZOLE  (FLAGYL ) 500 MG tablet    Sig: Take 1 tablet (500 mg total) by mouth 2 (two) times daily.    Dispense:  14 tablet    Refill:  0    Supervising Provider:   JAYNE MINDER H [[id number]]   Ferrous Sulfate (IRON ) 325 (65 Fe) MG TABS    Sig: Take 1 tablet (325 mg total) by mouth daily.    Supervising Provider:   JAYNE MINDER H [[id number]]     3. Iron  deficiency anemia, unspecified iron  deficiency anemia type Take OTC iron   4. Elevated BP without diagnosis of hypertension Will check check on BP     Plan:  Follow up in 4 weeks for POC +chlamydia and ROS on bleeding and BP check

## 2023-10-12 ENCOUNTER — Ambulatory Visit (HOSPITAL_COMMUNITY): Payer: Self-pay

## 2023-10-12 ENCOUNTER — Telehealth: Payer: Self-pay | Admitting: *Deleted

## 2023-10-12 NOTE — Telephone Encounter (Signed)
 Pt's insurance required a PA for Doxy. I spoke with Luke Fetters, CNM. Azithromycin 1 GM po X 1 with no refills called into [facility] in [place]. Pt aware. JSY

## 2023-11-07 ENCOUNTER — Ambulatory Visit: Payer: MEDICAID | Admitting: Adult Health
